# Patient Record
Sex: Female | Born: 1975 | Race: Black or African American | Hispanic: No | Marital: Married | Smoking: Never smoker
Health system: Southern US, Community
[De-identification: ages and names within clinical notes are randomized; demographics above are authoritative.]

## PROBLEM LIST (undated history)

## (undated) DIAGNOSIS — L509 Urticaria, unspecified: Secondary | ICD-10-CM

## (undated) HISTORY — DX: Urticaria, unspecified: L50.9

---

## 1998-05-05 ENCOUNTER — Emergency Department (HOSPITAL_COMMUNITY): Admission: EM | Admit: 1998-05-05 | Discharge: 1998-05-05 | Payer: Self-pay | Admitting: Emergency Medicine

## 1999-07-04 ENCOUNTER — Emergency Department (HOSPITAL_COMMUNITY): Admission: EM | Admit: 1999-07-04 | Discharge: 1999-07-04 | Payer: Self-pay | Admitting: Emergency Medicine

## 2000-06-16 ENCOUNTER — Emergency Department (HOSPITAL_COMMUNITY): Admission: EM | Admit: 2000-06-16 | Discharge: 2000-06-16 | Payer: Self-pay | Admitting: Emergency Medicine

## 2000-11-13 ENCOUNTER — Encounter: Payer: Self-pay | Admitting: Obstetrics & Gynecology

## 2000-11-13 ENCOUNTER — Inpatient Hospital Stay (HOSPITAL_COMMUNITY): Admission: AD | Admit: 2000-11-13 | Discharge: 2000-11-18 | Payer: Self-pay | Admitting: Obstetrics & Gynecology

## 2000-12-01 ENCOUNTER — Ambulatory Visit (HOSPITAL_COMMUNITY): Admission: RE | Admit: 2000-12-01 | Discharge: 2000-12-01 | Payer: Self-pay | Admitting: Obstetrics and Gynecology

## 2000-12-01 ENCOUNTER — Encounter: Payer: Self-pay | Admitting: Obstetrics and Gynecology

## 2001-01-17 ENCOUNTER — Emergency Department (HOSPITAL_COMMUNITY): Admission: EM | Admit: 2001-01-17 | Discharge: 2001-01-18 | Payer: Self-pay | Admitting: Emergency Medicine

## 2001-09-14 ENCOUNTER — Inpatient Hospital Stay (HOSPITAL_COMMUNITY): Admission: AD | Admit: 2001-09-14 | Discharge: 2001-09-14 | Payer: Self-pay | Admitting: Obstetrics and Gynecology

## 2002-04-14 ENCOUNTER — Inpatient Hospital Stay (HOSPITAL_COMMUNITY): Admission: AD | Admit: 2002-04-14 | Discharge: 2002-04-14 | Payer: Self-pay | Admitting: *Deleted

## 2002-05-25 ENCOUNTER — Inpatient Hospital Stay (HOSPITAL_COMMUNITY): Admission: AD | Admit: 2002-05-25 | Discharge: 2002-05-25 | Payer: Self-pay | Admitting: *Deleted

## 2002-07-29 ENCOUNTER — Emergency Department (HOSPITAL_COMMUNITY): Admission: EM | Admit: 2002-07-29 | Discharge: 2002-07-30 | Payer: Self-pay | Admitting: Emergency Medicine

## 2002-07-30 ENCOUNTER — Encounter: Payer: Self-pay | Admitting: Emergency Medicine

## 2002-11-07 ENCOUNTER — Encounter: Admission: RE | Admit: 2002-11-07 | Discharge: 2002-11-07 | Payer: Self-pay | Admitting: *Deleted

## 2002-11-07 ENCOUNTER — Encounter: Payer: Self-pay | Admitting: *Deleted

## 2002-11-13 ENCOUNTER — Other Ambulatory Visit: Admission: RE | Admit: 2002-11-13 | Discharge: 2002-11-13 | Payer: Self-pay | Admitting: Obstetrics and Gynecology

## 2002-12-24 ENCOUNTER — Ambulatory Visit (HOSPITAL_COMMUNITY): Admission: RE | Admit: 2002-12-24 | Discharge: 2002-12-24 | Payer: Self-pay | Admitting: Obstetrics and Gynecology

## 2002-12-24 ENCOUNTER — Encounter (INDEPENDENT_AMBULATORY_CARE_PROVIDER_SITE_OTHER): Payer: Self-pay | Admitting: Specialist

## 2003-09-16 ENCOUNTER — Inpatient Hospital Stay (HOSPITAL_COMMUNITY): Admission: AD | Admit: 2003-09-16 | Discharge: 2003-09-16 | Payer: Self-pay | Admitting: Obstetrics and Gynecology

## 2004-01-24 ENCOUNTER — Inpatient Hospital Stay (HOSPITAL_COMMUNITY): Admission: AD | Admit: 2004-01-24 | Discharge: 2004-01-25 | Payer: Self-pay | Admitting: Obstetrics and Gynecology

## 2004-07-13 ENCOUNTER — Inpatient Hospital Stay (HOSPITAL_COMMUNITY): Admission: AD | Admit: 2004-07-13 | Discharge: 2004-07-14 | Payer: Self-pay | Admitting: Obstetrics and Gynecology

## 2004-07-23 ENCOUNTER — Ambulatory Visit: Payer: Self-pay | Admitting: Obstetrics and Gynecology

## 2004-08-11 ENCOUNTER — Inpatient Hospital Stay (HOSPITAL_COMMUNITY): Admission: AD | Admit: 2004-08-11 | Discharge: 2004-08-11 | Payer: Self-pay | Admitting: Family Medicine

## 2005-08-25 ENCOUNTER — Emergency Department (HOSPITAL_COMMUNITY): Admission: EM | Admit: 2005-08-25 | Discharge: 2005-08-26 | Payer: Self-pay | Admitting: Emergency Medicine

## 2005-12-20 ENCOUNTER — Emergency Department (HOSPITAL_COMMUNITY): Admission: EM | Admit: 2005-12-20 | Discharge: 2005-12-20 | Payer: Self-pay | Admitting: Emergency Medicine

## 2006-06-17 ENCOUNTER — Ambulatory Visit (HOSPITAL_COMMUNITY): Payer: Self-pay | Admitting: Psychiatry

## 2006-06-27 ENCOUNTER — Emergency Department (HOSPITAL_COMMUNITY): Admission: EM | Admit: 2006-06-27 | Discharge: 2006-06-27 | Payer: Self-pay | Admitting: Emergency Medicine

## 2006-12-27 ENCOUNTER — Encounter: Admission: RE | Admit: 2006-12-27 | Discharge: 2006-12-27 | Payer: Self-pay | Admitting: Obstetrics

## 2006-12-29 ENCOUNTER — Ambulatory Visit: Payer: Self-pay | Admitting: Gastroenterology

## 2006-12-29 LAB — CONVERTED CEMR LAB
Albumin: 3.5 g/dL (ref 3.5–5.2)
BUN: 7 mg/dL (ref 6–23)
Basophils Absolute: 0 10*3/uL (ref 0.0–0.1)
Basophils Relative: 0.6 % (ref 0.0–1.0)
CO2: 27 meq/L (ref 19–32)
Calcium: 9.4 mg/dL (ref 8.4–10.5)
Chloride: 108 meq/L (ref 96–112)
Creatinine, Ser: 0.8 mg/dL (ref 0.4–1.2)
Eosinophils Relative: 1.4 % (ref 0.0–5.0)
GFR calc non Af Amer: 88 mL/min
Hemoglobin: 12.3 g/dL (ref 12.0–15.0)
Iron: 109 ug/dL (ref 42–145)
MCHC: 34.5 g/dL (ref 30.0–36.0)
Neutro Abs: 1.6 10*3/uL (ref 1.4–7.7)
Neutrophils Relative %: 40.8 % — ABNORMAL LOW (ref 43.0–77.0)
Saturation Ratios: 34 % (ref 20.0–50.0)
Sodium: 140 meq/L (ref 135–145)
Total Protein: 6.9 g/dL (ref 6.0–8.3)
WBC: 3.9 10*3/uL — ABNORMAL LOW (ref 4.5–10.5)

## 2007-01-01 ENCOUNTER — Emergency Department (HOSPITAL_COMMUNITY): Admission: EM | Admit: 2007-01-01 | Discharge: 2007-01-01 | Payer: Self-pay | Admitting: Emergency Medicine

## 2007-01-19 ENCOUNTER — Ambulatory Visit: Payer: Self-pay | Admitting: Gastroenterology

## 2007-01-23 ENCOUNTER — Ambulatory Visit: Payer: Self-pay | Admitting: Gastroenterology

## 2008-07-26 ENCOUNTER — Ambulatory Visit: Payer: Self-pay | Admitting: Gastroenterology

## 2008-08-09 ENCOUNTER — Emergency Department (HOSPITAL_COMMUNITY): Admission: EM | Admit: 2008-08-09 | Discharge: 2008-08-09 | Payer: Self-pay | Admitting: Emergency Medicine

## 2010-01-10 ENCOUNTER — Emergency Department (HOSPITAL_COMMUNITY): Admission: EM | Admit: 2010-01-10 | Discharge: 2010-01-10 | Payer: Self-pay | Admitting: Family Medicine

## 2010-05-19 ENCOUNTER — Emergency Department (HOSPITAL_COMMUNITY): Admission: EM | Admit: 2010-05-19 | Discharge: 2010-05-19 | Payer: Self-pay | Admitting: Emergency Medicine

## 2011-02-01 LAB — WET PREP, GENITAL

## 2011-02-01 LAB — POCT PREGNANCY, URINE: Preg Test, Ur: NEGATIVE

## 2011-02-01 LAB — POCT URINALYSIS DIP (DEVICE)
Hgb urine dipstick: NEGATIVE
Nitrite: NEGATIVE
Protein, ur: NEGATIVE mg/dL
pH: 7 (ref 5.0–8.0)

## 2011-02-01 LAB — GC/CHLAMYDIA PROBE AMP, GENITAL: GC Probe Amp, Genital: NEGATIVE

## 2011-02-09 ENCOUNTER — Emergency Department (HOSPITAL_COMMUNITY)
Admission: EM | Admit: 2011-02-09 | Discharge: 2011-02-09 | Disposition: A | Discharge status: Home / Self Care | Payer: BC Managed Care – PPO | Attending: Emergency Medicine | Admitting: Emergency Medicine

## 2011-02-09 DIAGNOSIS — M79609 Pain in unspecified limb: Secondary | ICD-10-CM | POA: Insufficient documentation

## 2011-03-26 NOTE — Group Therapy Note (Signed)
NAME:  [name], [name] NO.:  [id number]   MEDICAL RECORD NO.:  [id number]                   PATIENT TYPE:  WOC   LOCATION:  [facility]                           FACILITY:  [facility]   PHYSICIAN:  Argentina Donovan, MD                     DATE OF BIRTH:  [birth year]   DATE OF SERVICE:  07/23/2004                                    CLINIC NOTE   CHIEF COMPLAINT:  Follow-up from MAU visit for abdominal pain.   HISTORY OF PRESENT ILLNESS:  Ms. Furey is a 35 year old G2 P2-0-0-2  who presented with a few days' history of left lower quadrant pain described  as continuous, pressure-like, with a severity of 5/10.  This is not  associated with any nausea, vomiting, diarrhea, constipation.  The patient  reports having some slight vaginal discharge which was non-foul-smelling  though no vaginal or vulvar pruritus.  No dyspareunia.  No fever or chills  nor urinary symptoms.  She reports a 47-month history of menorrhagia with  periods lasting 5 days from a normal menstrual period of 3 days duration.  This is not associated with any dysmenorrhea.   PAST MEDICAL HISTORY:  Unremarkable.   SURGICAL HISTORY:  The patient had one cesarean section and a polypectomy of  a cervical polyp in 2003.   FAMILY HISTORY:  Heart disease in a grandfather and a grandmother.  Cancer  of the stomach in a grandfather.   MENSTRUAL HISTORY:  Menarche at 35 years old with regular monthly intervals  with 3 days in duration, moderate flow.  Last normal menstrual period was  August 17-21, 2005.   OBSTETRICAL HISTORY:  G2 P2-0-0-2.  One SVD and one cesarean section.   PERSONAL AND SOCIAL HISTORY:  Married.  Lives with husband and two kids.  No  smoking, no alcohol use or drug use.  No previous history of sexually-  transmitted diseases.   REVIEW OF SYSTEMS:  Negative except for left lower quadrant pain and  menorrhagia.   OBJECTIVE:  VITAL SIGNS:  Blood pressure 117/81, pulse rate of 74,  temperature of 99.4.  ABDOMEN:  Normal bowel sounds, flat, soft, no masses, no tenderness.  PELVIC:  Normal external genitalia.  Smooth vagina with minimal whitish  discharge, non-foul-smelling.  Cervix is smooth, firm, nontender on  wriggling.  Corpus is small.  No adnexal masses or tenderness.   ASSESSMENT:  Essentially gynecological findings.   PLAN:  Advised the patient to return to clinic if abdominal pain recurs.  At  the present, there is no need for further workup.  Advised her to follow up  with her primary M.D. for annual Pap smear.   ADDENDUM:  Ultrasound showed left follicular cyst 1.6 x 1.8 cm.  If her pain  persists, consider repeat transvaginal ultrasound.  Argentina Donovan, MD    PR/MEDQ  D:  07/23/2004  T:  07/23/2004  Job:  [id number]

## 2011-03-26 NOTE — Op Note (Signed)
NAME:  [name], [name]                      ACCOUNT NO.:  [id number]   MEDICAL RECORD NO.:  [id number]                   PATIENT TYPE:  AMB   LOCATION:  [facility]                                  [facility]   PHYSICIAN:  James A. Ashley Royalty, M.D.             DATE OF BIRTH:  August 04, 1976   DATE OF PROCEDURE:  12/24/2002  DATE OF DISCHARGE:                                 OPERATIVE REPORT   PREOPERATIVE DIAGNOSIS:  1. Endometrial polyp versus fibroid.  2. Menorrhagia and metrorrhagia, possibly secondary to #1.  Pathology     pending.   POSTOPERATIVE DIAGNOSIS:   OPERATION PERFORMED:  1. Diagnostic and operative hysteroscopy with removal of polyp versus     fibroid.  2. Dilation and curettage.   SURGEON:  Rudy Jew. Ashley Royalty, M.D.   ANESTHESIA:  General.   ESTIMATED BLOOD LOSS:  Less than 60 cc.   COMPLICATIONS:  None.   PACKS AND DRAINS:  None.   DEFICIT:  100 cc.   DESCRIPTION OF PROCEDURE:  The patient was taken to the operating room and  placed in the dorsal supine position.  After adequate general anesthesia was  administered, she was placed in the lithotomy position and prepped and  draped in the usual manner for vaginal surgery.  A posterior weighted  retractor was placed on the vagina.  The anterior lip of the cervix was  grasped with a single toothed tenaculum.  The uterus was gently sounded to  approximately 10 cm.  The cervix was then dilated to a size 29 [place] using  [employer] dilators.  The resectoscoped was placed into the uterine cavity using  Sorbitol as a distention medium.  The uterine cavity was inspected  completely.  The left and right tubal ostia were visualized without  difficulty.  The anterior and posterior aspects of the uterine corpus were  inspected.  On the right lateral aspect of the uterine corpus approximately  two thirds of the way inferiorly towards the cervix.  There was a sizable  apparent polyp.  Using the resectoscope at 90 watts cutting  wave form this  polyp was removed and submitted separately to pathology for histologic  studies.  Hemostasis was obtained with the coagulation wave form at 70 watts  power.  Appropriate photos were obtained and this portion of the procedure  terminated.   Attention was then turned to uterine curettage.  Medium-size curet was  introduced into the uterine cavity.  A four quadrant keratotomy was  performed.  Then a therapeutic technique was employed.  All curettings were  submitted to pathology for histologic studies.  At this point hemostasis was  noted.  The vaginal instruments were removed and the procedure terminated.   The patient was taken to the recovery room in excellent condition.  James A. Ashley Royalty, M.D.    JAM/MEDQ  D:  12/24/2002  T:  12/24/2002  Job:  [id number]

## 2011-03-26 NOTE — H&P (Signed)
   [name], [name]                            ACCOUNT NO.:  [id number]   MEDICAL RECORD NO.:  [id number]                   PATIENT TYPE:   LOCATION:                                       FACILITY:   PHYSICIAN:  James A. Ashley Royalty, M.D.             DATE OF BIRTH:   DATE OF ADMISSION:  DATE OF DISCHARGE:                                HISTORY & PHYSICAL   HISTORY OF PRESENT ILLNESS:  The patient is a 35 year old Gravida II, Para  II, who presented on November 13, 2002 complaining of menorrhagia and  metrorrhagia. Subsequent ultrasound with sono histogram revealed a 7 mm  echogenic focus in the mid endometrial canal adjacent to the right uterine  wall. The adnexa appeared essentially unremarkable bilaterally. The patient  presents for diagnostic/operative hysteroscopy with dilatation and  curettage.   PAST MEDICAL HISTORY:  Genital HSV.   PAST SURGICAL HISTORY:  Cesarean sections/BTST.   ALLERGIES:  PENICILLIN.   FAMILY HISTORY:  Positive for hypertension, diabetes mellitus, and breast  cancer.   SOCIAL HISTORY:  The patient denies the use of tobacco or significant  alcohol.   REVIEW OF SYSTEMS:  Noncontributory.   PHYSICAL EXAMINATION:  GENERAL: A well developed, well nourished, pleasant  black female in no acute distress.  VITAL SIGNS: Afebrile. Stable vital signs.  SKIN: Warm and dry without lesions.  LYMPH: No supraclavicular, cervical or inguinal adenopathy.  HEENT: Normocephalic.  NECK:  Supple with slight prominence to the thyroid.  LUNGS: Clear.  CARDIAC: Regular rate and rhythm. No murmur, rub, or gallop.  BREASTS: Soft and without palpable masses. No adenopathy.  ABDOMEN: Soft and nontender without masses or organomegaly. Bowel sounds are  active.  MS: No cardiovascular tenderness.  PELVIC: Deferred until examination under anesthesia.   IMPRESSION:  Menorrhagia and metrorrhagia with small intracavitary lesion  noted at sono histogram.   PLAN:  1. Diagnostic  laparoscopy and hysteroscopy.  2.     Dilatation and curettage.  3. Risks, benefits, complications and alternatives were discussed with the     patient. She states that she understands and accepts. Questions are     invited and answered.                                                 James A. Ashley Royalty, M.D.    JAM/MEDQ  D:  12/24/2002  T:  12/24/2002  Job:  [id number]

## 2011-03-26 NOTE — Assessment & Plan Note (Signed)
[facility]                         GASTROENTEROLOGY OFFICE NOTE   NAME:QUICK-YOUNGFrancesca, [name]                   MRN:          [id number]  DATE:12/29/2006                            DOB:          [birth year]    [name] is a 35 year old [place] American female referred  through the courtesy of Dr. Clearance Coots for evaluation of constipation and  rectal bleeding.   HISTORY OF PRESENT ILLNESS:  [name] has been in good health all  of her life without serious medical or surgical problems.  She has had  some chronic functional constipation for several years, alleviated  partially by fiber supplements.  She recently has been having some  painful bowel movements and seeing bright red blood per rectum.  She  does have some mild acid reflux symptoms and takes Prevacid on a p.r.n.  basis.  She follows a regular diet and denies specific food intolerances  including lactose.  She has never had colonoscopy or barium enema exams.  Her appetite is good and her weight is stable.   PAST MEDICAL HISTORY:  Entirely noncontributory except for previous  tubal ligation/cesarean section.  She has no history of chronic medical  difficulties.   FAMILY HISTORY:  Remarkable for breast cancer and diabetes, but no known  gastrointestinal problems.   SOCIAL HISTORY:  The patient is married and currently lives by herself.  She has 2 children.  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  She does not smoke or abuse ethanol.   REVIEW OF SYSTEMS:  Noncontributory.  The patient does have an IUD and  does not menstruate.   MEDICATIONS:  None.   SHE DOES HAVE A HISTORY OF PENICILLIN ALLERGY WITH URTICARIA.   EXAMINATION:  She is an attractive, healthy appearing female in no acute  distress, appearing her stated age.  She is 5 feet 7 inches tall, weighs  185 pounds.  Blood pressure 120/76 and pulse was 80 and regular.  Could  not appreciate a  stigmata of chronic liver disease.  There was no  thyromegaly or lymphadenopathy noted.  CHEST:  Clear to percussion and auscultation.  She appeared to be in a regular rhythm without significant murmurs,  gallops, or rubs.  ABDOMINAL:  No organomegaly, masses, or tenderness.  Bowel sounds were  normal.  PERIPHERAL EXTREMITIES:  Were unremarkable.   Inspection of the rectum was unremarkable except for some rectal spasm.  I could not see a definite fissure or fistula.  There were no masses or  tenderness to the rectum, and stool was guaiac negative.   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   RECOMMENDATIONS:  1. High fiber diet with daily bulking substance.  2. Twice a day sitz baths with Xyralid RC and LC lotion and cream      twice a day.  3. GI followup in 2-3 weeks' time to see how she is doing      symptomatically and consider colonoscopy exam depending on her      clinical course.  Vania Rea. Jarold Motto, MD, Caleen Essex, FAGA  Electronically Signed    DRP/MedQ  DD: 12/29/2006  DT: 12/29/2006  Job #: [id number]   cc:   Leonette Most A. Clearance Coots, M.D.

## 2011-03-26 NOTE — Discharge Summary (Signed)
[facility]  Patient:    CRISTIN, PENAFLOR                   MRN: [id number] Adm. Date:  [id number] Disc. Date: [id number] Attending:  Mickle Mallory Dictator:   Leilani Able, P.A.                           Discharge Summary  FINAL DIAGNOSES:              A 35 year old with left pyelonephritis.  HISTORY:                      This 35 year old G2, P2 presents to triage complaining of left flank pain.  Urine was performed at triage that did look infected and she was given the diagnosis of left pyelonephritis.  She was admitted for IV Ancef.  Patients white blood cell count was about 13.1 on admission and urine cultures were sent.  Patients hospital course complicated by some nausea.  She did not really have any emesis.  She continued to have pain.  Her urine culture came back showing Staph resistant to Ancef and therefore her antibiotics were changed to gentamycin on hospital day #2. Patient became afebrile during her hospital stay and she was felt ready for discharge on hospital day #5.  Her flank pain had resolved at this time.  She was sent home on a regular diet.  Told to decrease activities.  Was given seven days of Macrobid 100 mg b.i.d. and was to call with any temperatures or increase in her pain. DD:  12/14/00 TD:  12/14/00 Job: [id number]

## 2011-04-06 ENCOUNTER — Inpatient Hospital Stay (HOSPITAL_COMMUNITY)
Admission: AD | Admit: 2011-04-06 | Discharge: 2011-04-06 | Disposition: A | Discharge status: Home / Self Care | Payer: BC Managed Care – PPO | Source: Ambulatory Visit | Attending: Family Medicine | Admitting: Family Medicine

## 2011-04-06 ENCOUNTER — Inpatient Hospital Stay (HOSPITAL_COMMUNITY): Payer: BC Managed Care – PPO

## 2011-04-06 DIAGNOSIS — A499 Bacterial infection, unspecified: Secondary | ICD-10-CM | POA: Insufficient documentation

## 2011-04-06 DIAGNOSIS — N83209 Unspecified ovarian cyst, unspecified side: Secondary | ICD-10-CM

## 2011-04-06 DIAGNOSIS — N760 Acute vaginitis: Secondary | ICD-10-CM

## 2011-04-06 DIAGNOSIS — R109 Unspecified abdominal pain: Secondary | ICD-10-CM | POA: Insufficient documentation

## 2011-04-06 DIAGNOSIS — B9689 Other specified bacterial agents as the cause of diseases classified elsewhere: Secondary | ICD-10-CM | POA: Insufficient documentation

## 2011-04-06 LAB — POCT PREGNANCY, URINE: Preg Test, Ur: NEGATIVE

## 2011-04-06 LAB — DIFFERENTIAL
Eosinophils Absolute: 0.1 10*3/uL (ref 0.0–0.7)
Eosinophils Relative: 2 % (ref 0–5)
Monocytes Absolute: 0.6 10*3/uL (ref 0.1–1.0)
Neutro Abs: 2.9 10*3/uL (ref 1.7–7.7)

## 2011-04-06 LAB — URINALYSIS, ROUTINE W REFLEX MICROSCOPIC
Bilirubin Urine: NEGATIVE
Glucose, UA: NEGATIVE mg/dL
Ketones, ur: 15 mg/dL — AB
Protein, ur: NEGATIVE mg/dL
Urobilinogen, UA: 0.2 mg/dL (ref 0.0–1.0)
pH: 7 (ref 5.0–8.0)

## 2011-04-06 LAB — CBC
MCH: 31.5 pg (ref 26.0–34.0)
MCHC: 32.9 g/dL (ref 30.0–36.0)
RBC: 3.87 MIL/uL (ref 3.87–5.11)

## 2011-04-06 LAB — WET PREP, GENITAL

## 2011-04-07 LAB — URINE CULTURE: Culture  Setup Time: 201205300109

## 2011-08-10 LAB — POCT I-STAT, CHEM 8
BUN: 8
Calcium, Ion: 1.2
Glucose, Bld: 108 — ABNORMAL HIGH
Hemoglobin: 12.9

## 2011-08-20 ENCOUNTER — Inpatient Hospital Stay (INDEPENDENT_AMBULATORY_CARE_PROVIDER_SITE_OTHER)
Admission: RE | Admit: 2011-08-20 | Discharge: 2011-08-20 | Disposition: A | Discharge status: Home / Self Care | Payer: BC Managed Care – PPO | Source: Ambulatory Visit | Attending: Family Medicine | Admitting: Family Medicine

## 2011-08-20 DIAGNOSIS — J40 Bronchitis, not specified as acute or chronic: Secondary | ICD-10-CM

## 2011-08-20 DIAGNOSIS — J019 Acute sinusitis, unspecified: Secondary | ICD-10-CM

## 2014-11-14 ENCOUNTER — Encounter: Payer: Self-pay | Admitting: Gastroenterology

## 2015-05-29 ENCOUNTER — Ambulatory Visit (HOSPITAL_COMMUNITY): Payer: Self-pay | Admitting: Psychiatry

## 2015-06-03 ENCOUNTER — Other Ambulatory Visit: Payer: Self-pay | Admitting: Physician Assistant

## 2015-06-03 ENCOUNTER — Ambulatory Visit
Admission: RE | Admit: 2015-06-03 | Discharge: 2015-06-03 | Disposition: A | Discharge status: Home / Self Care | Payer: BC Managed Care – PPO | Source: Ambulatory Visit | Attending: Physician Assistant | Admitting: Physician Assistant

## 2015-06-03 DIAGNOSIS — W19XXXA Unspecified fall, initial encounter: Secondary | ICD-10-CM

## 2015-12-04 ENCOUNTER — Telehealth: Payer: Self-pay | Admitting: Podiatry

## 2015-12-04 NOTE — Telephone Encounter (Signed)
Left voicemail for pt to return call/ we received a referral from Dr. Tasia Catchings

## 2015-12-18 ENCOUNTER — Ambulatory Visit: Payer: BC Managed Care – PPO | Admitting: Podiatry

## 2015-12-29 ENCOUNTER — Encounter: Payer: Self-pay | Admitting: Podiatry

## 2015-12-29 ENCOUNTER — Ambulatory Visit (INDEPENDENT_AMBULATORY_CARE_PROVIDER_SITE_OTHER): Payer: BC Managed Care – PPO | Admitting: Podiatry

## 2015-12-29 VITALS — BP 151/107 | HR 79 | Resp 14

## 2015-12-29 DIAGNOSIS — M2041 Other hammer toe(s) (acquired), right foot: Secondary | ICD-10-CM

## 2015-12-29 NOTE — Progress Notes (Signed)
   Subjective:    Patient ID: [name], female    DOB: 09-01-1976, 40 y.o.   MRN: [id number]  HPI I have a painful thick area on my right 5th toe that Dr. Elijah Birk said is caused by my hammer toe and sent me to you to discuss treatment.     Review of Systems  All other systems reviewed and are negative.      Objective:   Physical Exam        Assessment & Plan:

## 2015-12-29 NOTE — Patient Instructions (Signed)
Pre-Operative Instructions  Congratulations, you have decided to take an important step to improving your quality of life.  You can be assured that the doctors of [facility] will be with you every step of the way.  1. Plan to be at the surgery center/hospital at least 1 (one) hour prior to your scheduled time unless otherwise directed by the surgical center/hospital staff.  You must have a responsible adult accompany you, remain during the surgery and drive you home.  Make sure you have directions to the surgical center/hospital and know how to get there on time. 2. For hospital based surgery you will need to obtain a history and physical form from your family physician within 1 month prior to the date of surgery- we will give you a form for you primary physician.  3. We make every effort to accommodate the date you request for surgery.  There are however, times where surgery dates or times have to be moved.  We will contact you as soon as possible if a change in schedule is required.   4. No Aspirin/Ibuprofen for one week before surgery.  If you are on aspirin, any non-steroidal anti-inflammatory medications (Mobic, Aleve, Ibuprofen) you should stop taking it 7 days prior to your surgery.  You make take Tylenol  For pain prior to surgery.  5. Medications- If you are taking daily heart and blood pressure medications, seizure, reflux, allergy, asthma, anxiety, pain or diabetes medications, make sure the surgery center/hospital is aware before the day of surgery so they may notify you which medications to take or avoid the day of surgery. 6. No food or drink after midnight the night before surgery unless directed otherwise by surgical center/hospital staff. 7. No alcoholic beverages 24 hours prior to surgery.  No smoking 24 hours prior to or 24 hours after surgery. 8. Wear loose pants or shorts- loose enough to fit over bandages, boots, and casts. 9. No slip on shoes, sneakers are best. 10. Bring  your boot with you to the surgery center/hospital.  Also bring crutches or a walker if your physician has prescribed it for you.  If you do not have this equipment, it will be provided for you after surgery. 11. If you have not been contracted by the surgery center/hospital by the day before your surgery, call to confirm the date and time of your surgery. 12. Leave-time from work may vary depending on the type of surgery you have.  Appropriate arrangements should be made prior to surgery with your employer. 13. Prescriptions will be provided immediately following surgery by your doctor.  Have these filled as soon as possible after surgery and take the medication as directed. 14. Remove nail polish on the operative foot. 15. Wash the night before surgery.  The night before surgery wash the foot and leg well with the antibacterial soap provided and water paying special attention to beneath the toenails and in between the toes.  Rinse thoroughly with water and dry well with a towel.  Perform this wash unless told not to do so by your physician.  Enclosed: 1 Ice pack (please put in freezer the night before surgery)   1 Hibiclens skin cleaner   Pre-op Instructions  If you have any questions regarding the instructions, do not hesitate to call our office.  [place]: [address] [place], [place] [address] [phone]  [place]: [address] [place], [place] [address] [phone]  [place]: [address]  [place], [place] [address] [phone]  Dr. Richard   Tuchman DPM, Dr. Norman Regal DPM Dr. Richard Sikora DPM, Dr. M. Todd Hyatt DPM, Dr. Kathryn Egerton DPM 

## 2015-12-31 NOTE — Progress Notes (Signed)
Subjective:     Patient ID: [name], female   DOB: 01-04-1976, 40 y.o.   MRN: [id number]  HPI patient states my right fifth toe is really bothering me and making it hard to wear shoe gear. I've tried wider shoes I tried padding I've tried cushioning it without relief and it's been getting worse for the last year   Review of Systems  All other systems reviewed and are negative.      Objective:   Physical Exam  Constitutional: She is oriented to person, place, and time.  Cardiovascular: Intact distal pulses.   Musculoskeletal: Normal range of motion.  Neurological: She is oriented to person, place, and time.  Skin: Skin is warm.  Nursing note and vitals reviewed.  neurovascular status intact muscle strength adequate range of motion within normal limits with patient found to have a keratotic lesion fifth digit right that's painful when pressed with rotation of the toe noted and fluid buildup at the interphalangeal joint. 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 At this point I do think it needs to be fixed and I reviewed the procedure and what would be required. Due to long-standing nature and failure to respond to conservative care she has opted for surgical intervention at this time I allowed her to read a consent form reviewing all possible alternative treatments and complications as listed. Patient wants surgery and signs consent form and is given all preoperative instructions and understands recovery can take up to 6 months. Patient is scheduled for surgery and is encouraged to call with any questions prior to procedure  Reviewing x-rays indicated enlargement had a proximal phalanx fifth digit right with mild rotation of the toe

## 2016-01-13 DIAGNOSIS — M2041 Other hammer toe(s) (acquired), right foot: Secondary | ICD-10-CM | POA: Diagnosis not present

## 2016-01-14 ENCOUNTER — Telehealth: Payer: Self-pay | Admitting: *Deleted

## 2016-01-14 NOTE — Telephone Encounter (Signed)
I spoke with pt she states her foot is really throbbing after her surgery yesterday with Dr. Charlsie Merlesegal.  Pt states is taking the pain medication and Aleve without relief.  I instructed pt to not be up on her foot more than 5 minutes/hour, and to sit down, remove the boot, open-ended sock and the ace wrap, then elevate for 15 minutes, then put foot level and rewrap the ace looser.  I told pt the only exception to the elevation was if after removing the ace and elevating the foot, it was more painful, then to dangle the foot for 15 minutes, then place the foot level and rewrap the ace looser and continue with the post-op instructions of ice and elevation.  Pt states understanding.

## 2016-01-16 ENCOUNTER — Encounter: Payer: Self-pay | Admitting: *Deleted

## 2016-01-16 ENCOUNTER — Telehealth: Payer: Self-pay | Admitting: Podiatry

## 2016-01-16 NOTE — Telephone Encounter (Addendum)
Pt called asking for a note for being out of work. I don't see how long she is to be out of work in the note. She will have her daughter pick up the note when ready

## 2016-01-16 NOTE — Telephone Encounter (Signed)
"  I had surgery on 01/13/2016.  I was supposed to have received a note for my job saying how long I would be out of work.  I didn't receive anything.  I called this morning and spoke to someone but I didn't hear anything back yet and I have to get this in.  I work for [employer]."  You could be out of work for about 4-6 weeks.  How long do you want to be out?  "Put me down for 4 weeks, if I need to be out longer I can get it later."  You can come by to pick it up today.  "I'll have my husband come by to pick it up."

## 2016-01-23 ENCOUNTER — Ambulatory Visit (INDEPENDENT_AMBULATORY_CARE_PROVIDER_SITE_OTHER): Payer: BC Managed Care – PPO | Admitting: Podiatry

## 2016-01-23 ENCOUNTER — Ambulatory Visit: Payer: Self-pay

## 2016-01-23 VITALS — Temp 98.5°F

## 2016-01-23 DIAGNOSIS — M2041 Other hammer toe(s) (acquired), right foot: Secondary | ICD-10-CM | POA: Diagnosis not present

## 2016-01-23 DIAGNOSIS — Z9889 Other specified postprocedural states: Secondary | ICD-10-CM | POA: Diagnosis not present

## 2016-01-26 NOTE — Progress Notes (Signed)
Subjective:     Patient ID: [name], female   DOB: 1976/04/28, 40 y.o.   MRN: [id number]  HPI patient states I'm doing real well with my right foot   Review of Systems     Objective:   Physical Exam Neurovascular status intact negative Homans sign noted with well healed right fifth toe with wound edges well coapted toe in good alignment with minimal edema    Assessment:     Doing well post arthroplasty digit 5 right    Plan:     Reapplied sterile dressing reviewed x-rays and advised on continued elevation. Reappoint for us to recheck  X-ray report indicated satisfactory section had a proximal phalanx right with no indications of pathology

## 2016-02-02 IMAGING — CR DG KNEE COMPLETE 4+V*R*
3 series · 3 of 3 positions shown · non-contrast
Comparison: None.

CLINICAL DATA: Fell 1 week ago with pain and swelling medially

EXAM:
RIGHT KNEE - COMPLETE 4+ VIEW

[w knee ap right (1 of 2)]
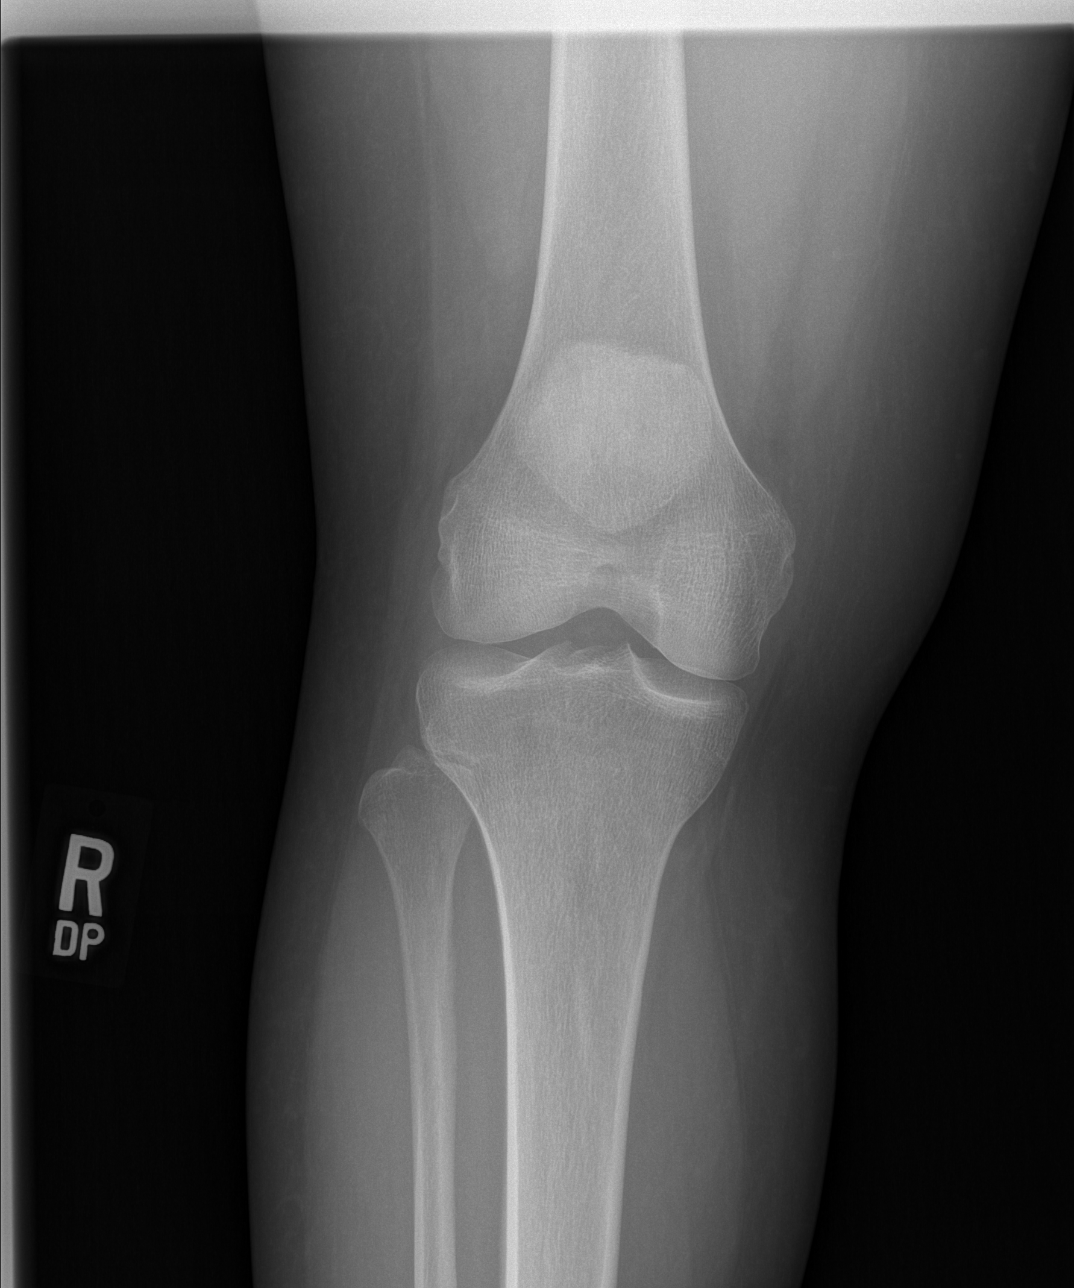

[w knee ap right (2 of 2)]
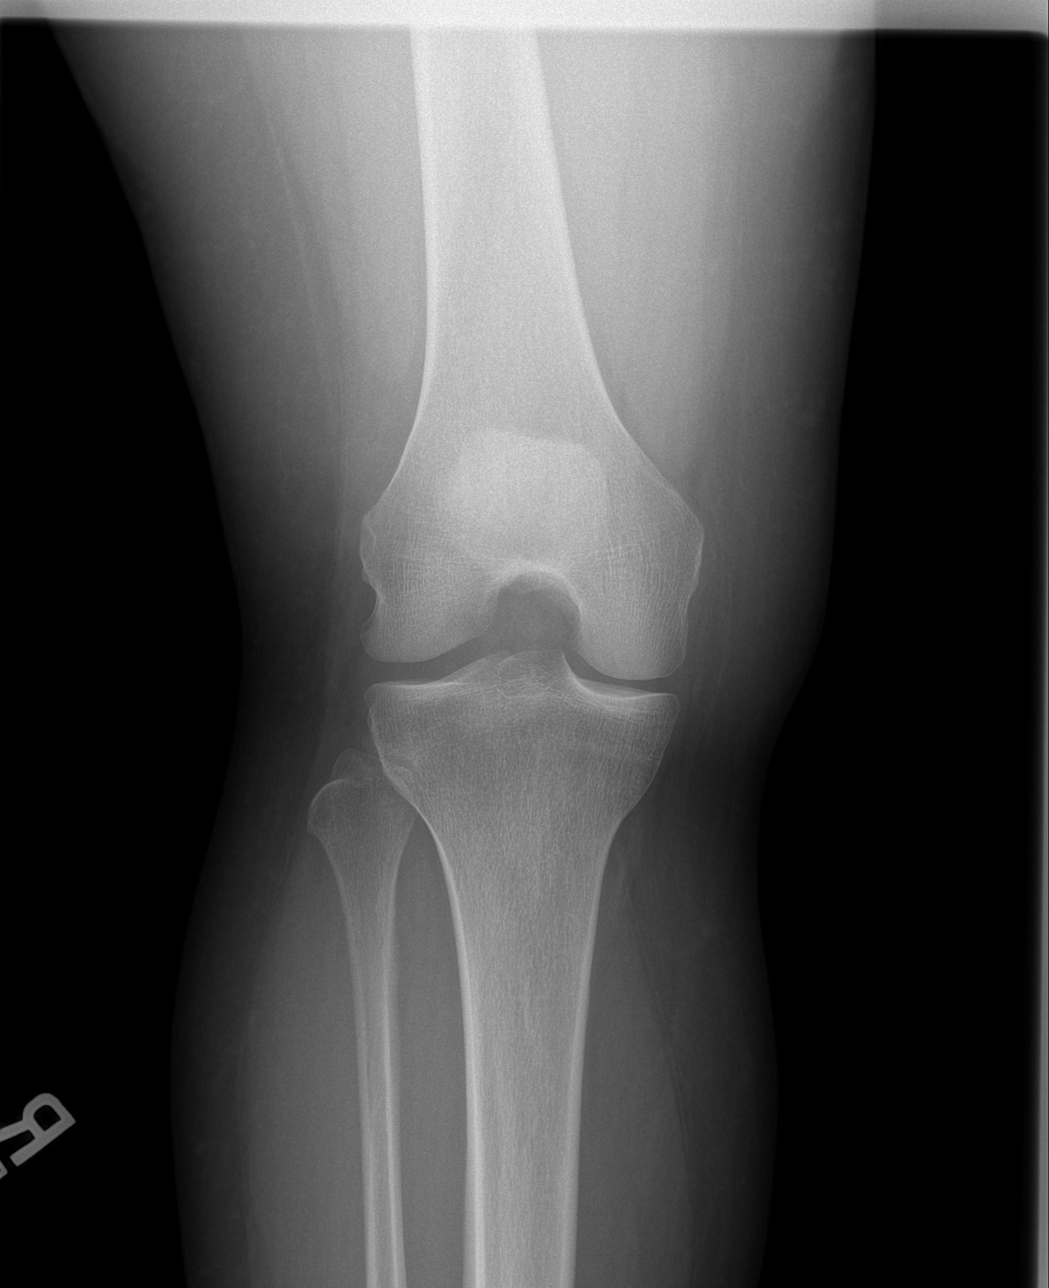

[w knee lat. right]
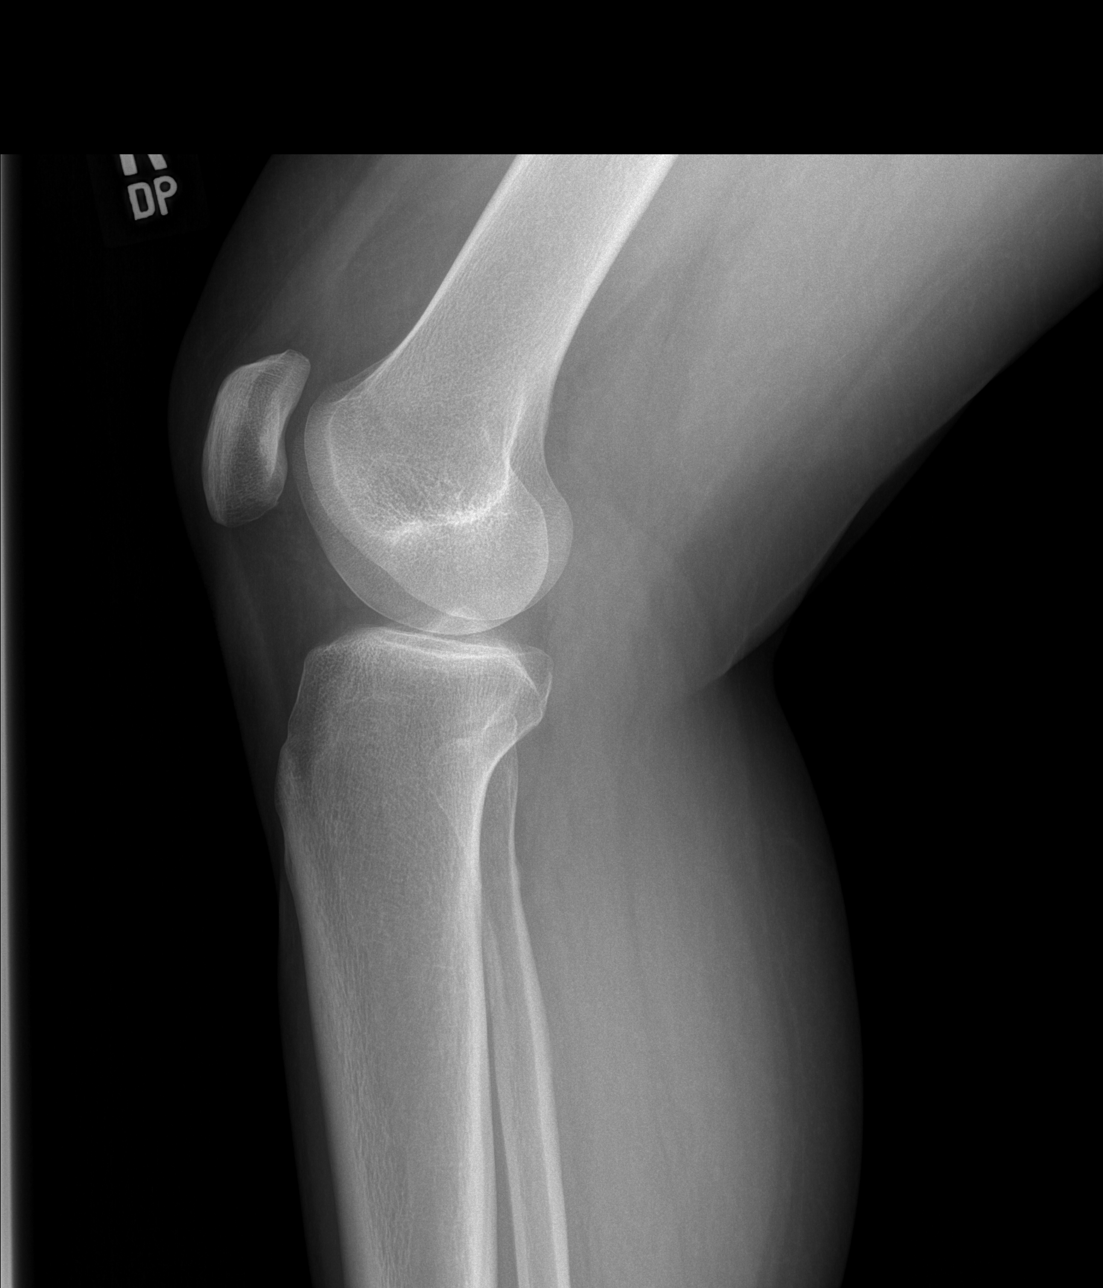

[3 of 3 positions shown; findings below may reference images not displayed]

FINDINGS: No acute fracture is seen. No joint effusion is noted. there may be
minimal decrease in medial joint space. The patella is slightly
laterally positioned.
IMPRESSION: No acute abnormality.  Slightly diminished medial joint space.

## 2016-02-04 ENCOUNTER — Ambulatory Visit (INDEPENDENT_AMBULATORY_CARE_PROVIDER_SITE_OTHER): Payer: BC Managed Care – PPO | Admitting: Podiatry

## 2016-02-04 ENCOUNTER — Ambulatory Visit (INDEPENDENT_AMBULATORY_CARE_PROVIDER_SITE_OTHER): Payer: BC Managed Care – PPO

## 2016-02-04 DIAGNOSIS — M2041 Other hammer toe(s) (acquired), right foot: Secondary | ICD-10-CM

## 2016-02-04 DIAGNOSIS — Z9889 Other specified postprocedural states: Secondary | ICD-10-CM

## 2016-02-04 NOTE — Progress Notes (Signed)
Subjective:     Patient ID: [name], female   DOB: May 06, 1976, 40 y.o.   MRN: [id number]  HPI patient states I'm doing real well with my toe with minimal swelling and incision site that's healing well   Review of Systems     Objective:   Physical Exam Neurovascular status intact negative Homans sign noted with wound edges well coapted fifth digit right with stitches intact    Assessment:     Doing well post arthroplasty digit 5 right    Plan:     Stitches removed x-ray reviewed wound edges were instructed on continued elevation and using open toed shoes. Reappoint as needed  X-ray report indicated assessment dissection had approximate the hallux with no indications of pathology

## 2016-07-02 ENCOUNTER — Encounter: Payer: Self-pay | Admitting: *Deleted

## 2016-07-02 NOTE — Progress Notes (Signed)
   DOS 01-13-16  Hammer toe 5th toe right

## 2018-04-13 ENCOUNTER — Encounter (INDEPENDENT_AMBULATORY_CARE_PROVIDER_SITE_OTHER): Payer: BC Managed Care – PPO

## 2018-04-24 ENCOUNTER — Ambulatory Visit (INDEPENDENT_AMBULATORY_CARE_PROVIDER_SITE_OTHER): Payer: BC Managed Care – PPO | Admitting: Family Medicine

## 2019-04-18 ENCOUNTER — Telehealth: Payer: Self-pay | Admitting: Cardiology

## 2019-04-18 NOTE — Telephone Encounter (Signed)
Routed to MD's nurse. OK to put patient on DOD day for new patient eval for chest tightness - no recent EKG in system

## 2019-04-18 NOTE — Telephone Encounter (Signed)
LMTCB - NEW Patient.  Please schedule with Dr. Harrell Gave week of June 15.  Thanks

## 2019-04-18 NOTE — Telephone Encounter (Signed)
Follow up    Pt is scheduled for a new patient appt and wants to know if she can be seen in office or if it has to be a Virtual Visit     Please call back

## 2019-04-19 NOTE — Telephone Encounter (Signed)
Pt returned this office call please give her a call back. °

## 2019-04-19 NOTE — Telephone Encounter (Signed)
   Pt state she would prefer to be seen in office. Appointment changed to 6/26 at 3 pm with Dr. Harrell Gave.  COVID-19 Pre-Screening Questions:  . In the past 7 to 10 days have you had a cough,  shortness of breath, headache, congestion, fever (100 or greater) body aches, chills, sore throat, or sudden loss of taste or sense of smell? No . Have you been around anyone with known Covid 19. No . Have you been around anyone who is awaiting Covid 19 test results in the past 7 to 10 days?N o . Have you been around anyone who has been exposed to Covid 19, or has mentioned symptoms of Covid 19 within the past 7 to 10 days? No  If you have any concerns/questions about symptoms patients report during screening (either on the phone or at threshold). Contact the provider seeing the patient or DOD for further guidance.  If neither are available contact a member of the leadership team.

## 2019-04-19 NOTE — Telephone Encounter (Signed)
Left message to call back  

## 2019-04-23 ENCOUNTER — Ambulatory Visit: Payer: Self-pay | Admitting: Surgery

## 2019-04-23 NOTE — H&P (Signed)
[name] Documented: 04/23/2019 10:58 AM Location: [facility] Patient #: [id number] DOB: [birth year] Married / Language: English / Race: Black or African American Female  History of Present Illness [name](Dalton Molesworth C. Ronel Rodeheaver MD; 04/23/2019 11:39 AM) The patient is a 43 year old female who presents with hemorrhoids. Note for "Hemorrhoids": ` ` ` Patient sent for surgical consultation at the request of Dr Annita Brodousin, [facility]  Chief Complaint: Prolapsing hemorrhoids ` ` The patient is a pleasant obese woman struggled with hemorrhoids that pop out. Occasional bleeding irritation. Discussed with her gynecologist. Surgical consultation offered. She's had hemorrhoid issues for the past few decades. This past 6 months no she feels like something is out all the time with occasional bleeding and irritation. Trying to use topical medications & soaks but still frustrating. She does have a history of irregular bowels for most of her life where she can be constipated for week and then have numerous bowel movements for a few days. She had a colonoscopy by Dr. Jarold MottoPatterson with [facility] in 2008 that was completely normal. She does not smoke. Not diabetic. She can walk at least a half hour without difficulty. She's had C-sections but no other abdominal surgery.  No personal nor family history of GI/colon cancer, inflammatory bowel disease, irritable bowel syndrome, allergy such as Celiac Sprue, dietary/dairy problems, colitis, ulcers nor gastritis. No recent sick contacts/gastroenteritis. No travel outside the country. No changes in diet. No dysphagia to solids or liquids. No significant heartburn or reflux. No hematemesis, coffee ground emesis. No evidence of prior gastric/peptic ulceration.  (Review of systems as stated in this history (HPI) or in the review of systems. Otherwise all other 12 point ROS are negative) ` ` `   Past Surgical History Kristen Cardinal(Sabrina  Canty, CMA; 04/23/2019 10:58 AM) Cesarean Section - 1 Foot Surgery Right. Oral Surgery  Diagnostic Studies History Kristen Cardinal(Sabrina Canty, CMA; 04/23/2019 10:58 AM) Colonoscopy >10 years ago Mammogram within last year Pap Smear 1-5 years ago  Allergies Kristen Cardinal(Sabrina Canty, CMA; 04/23/2019 10:59 AM) Penicillins Allergies Reconciled  Medication History (Sabrina Canty, CMA; 04/23/2019 11:00 AM) Dicyclomine HCl (20MG  Tablet, Oral) Active. Clindamycin Phos-Benzoyl Perox (1.2-5% Gel, External) Active. Meloxicam (15MG  Tablet, Oral) Active. Hydrocortisone (Perianal) (2.5% Cream, External) Active. Hyoscyamine Sulfate (0.125MG  Tablet, Oral) Active. valACYclovir HCl (500MG  Tablet, Oral) Active. Saxenda (18MG /3ML Soln Pen-inj, Subcutaneous) Active. Pantoprazole Sodium (40MG  Tablet DR, Oral) Active. Medications Reconciled  Social History Kristen Cardinal(Sabrina Canty, CMA; 04/23/2019 10:58 AM) Alcohol use Occasional alcohol use. Caffeine use Carbonated beverages. No drug use Tobacco use Never smoker.  Family History Kristen Cardinal([place]abrina [place]; 04/23/2019 10:58 AM) First Degree Relatives No pertinent family history  Pregnancy / Birth History Kristen Cardinal(Sabrina Canty, CMA; 04/23/2019 10:58 AM) Contraceptive History Contraceptive implant. Gravida 2 Length (months) of breastfeeding 3-6 Maternal age 45-25 Para 2  Other Problems Kristen Cardinal(Sabrina Canty, CMA; 04/23/2019 10:58 AM) Gastroesophageal Reflux Disease     Review of Systems (Sabrina Canty CMA; 04/23/2019 10:58 AM) General Not Present- Appetite Loss, Chills, Fatigue, Fever, Night Sweats, Weight Gain and Weight Loss. Skin Not Present- Change in Wart/Mole, Dryness, Hives, Jaundice, New Lesions, Non-Healing Wounds, Rash and Ulcer. HEENT Not Present- Earache, Hearing Loss, Hoarseness, Nose Bleed, Oral Ulcers, Ringing in the Ears, Seasonal Allergies, Sinus Pain, Sore Throat, Visual Disturbances, Wears glasses/contact lenses and Yellow Eyes. Respiratory Not Present-  Bloody sputum, Chronic Cough, Difficulty Breathing, Snoring and Wheezing. Cardiovascular Not Present- Chest Pain, Difficulty Breathing Lying Down, Leg Cramps, Palpitations, Rapid Heart Rate, Shortness of Breath and Swelling of Extremities.  Gastrointestinal Not Present- Abdominal Pain, Bloating, Bloody Stool, Change in Bowel Habits, Chronic diarrhea, Constipation, Difficulty Swallowing, Excessive gas, Gets full quickly at meals, Hemorrhoids, Indigestion, Nausea, Rectal Pain and Vomiting. Female Genitourinary Not Present- Frequency, Nocturia, Painful Urination, Pelvic Pain and Urgency. Musculoskeletal Not Present- Back Pain, Joint Pain, Joint Stiffness, Muscle Pain, Muscle Weakness and Swelling of Extremities. Neurological Not Present- Decreased Memory, Fainting, Headaches, Numbness, Seizures, Tingling, Tremor, Trouble walking and Weakness. Psychiatric Not Present- Anxiety, Bipolar, Change in Sleep Pattern, Depression, Fearful and Frequent crying. Endocrine Not Present- Cold Intolerance, Excessive Hunger, Hair Changes, Heat Intolerance, Hot flashes and New Diabetes. Hematology Not Present- Blood Thinners, Easy Bruising, Excessive bleeding, Gland problems, HIV and Persistent Infections.  Vitals (Sabrina Canty CMA; 04/23/2019 11:00 AM) 04/23/2019 11:00 AM Weight: 187 lb Height: 68in Body Surface Area: 1.99 m Body Mass Index: 28.43 kg/m  Temp.: 97.28F(Temporal)  Pulse: 103 (Regular)  BP: 138/60 (Sitting, Left Arm, Standard)        Physical Exam [name](Geraldene Eisel C. Adelene Polivka MD; 04/23/2019 11:36 AM)  General Mental Status-Alert. General Appearance-Not in acute distress, Not Sickly. Orientation-Oriented X3. Hydration-Well hydrated. Voice-Normal.  Integumentary Global Assessment Upon inspection and palpation of skin surfaces of the - Axillae: non-tender, no inflammation or ulceration, no drainage. and Distribution of scalp and body hair is normal. General  Characteristics Temperature - normal warmth is noted.  Head and Neck Head-normocephalic, atraumatic with no lesions or palpable masses. Face Global Assessment - atraumatic, no absence of expression. Neck Global Assessment - no abnormal movements, no bruit auscultated on the right, no bruit auscultated on the left, no decreased range of motion, non-tender. Trachea-midline. Thyroid Gland Characteristics - non-tender.  Eye Eyeball - Left-Extraocular movements intact, No Nystagmus - Left. Eyeball - Right-Extraocular movements intact, No Nystagmus - Right. Cornea - Left-No Hazy - Left. Cornea - Right-No Hazy - Right. Sclera/Conjunctiva - Left-No scleral icterus, No Discharge - Left. Sclera/Conjunctiva - Right-No scleral icterus, No Discharge - Right. Pupil - Left-Direct reaction to light normal. Pupil - Right-Direct reaction to light normal.  ENMT Ears Pinna - Left - no drainage observed, no generalized tenderness observed. Pinna - Right - no drainage observed, no generalized tenderness observed. Nose and Sinuses External Inspection of the Nose - no destructive lesion observed. Inspection of the nares - Left - quiet respiration. Inspection of the nares - Right - quiet respiration. Mouth and Throat Lips - Upper Lip - no fissures observed, no pallor noted. Lower Lip - no fissures observed, no pallor noted. Nasopharynx - no discharge present. Oral Cavity/Oropharynx - Tongue - no dryness observed. Oral Mucosa - no cyanosis observed. Hypopharynx - no evidence of airway distress observed.  Chest and Lung Exam Inspection Movements - Normal and Symmetrical. Accessory muscles - No use of accessory muscles in breathing. Palpation Palpation of the chest reveals - Non-tender. Auscultation Breath sounds - Normal and Clear.  Cardiovascular Auscultation Rhythm - Regular. Murmurs & Other Heart Sounds - Auscultation of the heart reveals - No Murmurs and No Systolic  Clicks.  Abdomen Inspection Inspection of the abdomen reveals - No Visible peristalsis and No Abnormal pulsations. Umbilicus - No Bleeding, No Urine drainage. Palpation/Percussion Palpation and Percussion of the abdomen reveal - Soft, Non Tender, No Rebound tenderness, No Rigidity (guarding) and No Cutaneous hyperesthesia. Note: Abdomen soft. Not severely distended. No distasis recti. No umbilical or other anterior abdominal wall hernias  Female Genitourinary Sexual Maturity Tanner 5 - Adult hair pattern. Note: No vaginal bleeding nor discharge  Rectal Note: Right anterior greater  than posterior external hemorrhoids. Tolerates digital & anoscopic internal exam. Left lateral swollen irritated internal hemorrhoid that appears to prolapse easily as well. Right anterior irritated.  Perianal skin clean with good hygiene. No pruritis ani. No pilonidal disease. No fissure. No abscess/fistula. Normal sphincter tone. No condyloma warts. Tolerates digital and anoscopic rectal exam. No rectal masses. Exam done with assistance of female Medical Assistant in the room.  Peripheral Vascular Upper Extremity Inspection - Left - No Cyanotic nailbeds - Left, Not Ischemic. Inspection - Right - No Cyanotic nailbeds - Right, Not Ischemic.  Neurologic Neurologic evaluation reveals -normal attention span and ability to concentrate, able to name objects and repeat phrases. Appropriate fund of knowledge , normal sensation and normal coordination. Mental Status Affect - not angry, not paranoid. Cranial Nerves-Normal Bilaterally. Gait-Normal.  Neuropsychiatric Mental status exam performed with findings of-able to articulate well with normal speech/language, rate, volume and coherence, thought content normal with ability to perform basic computations and apply abstract reasoning and no evidence of hallucinations, delusions, obsessions or homicidal/suicidal ideation.  Musculoskeletal Global  Assessment Spine, Ribs and Pelvis - no instability, subluxation or laxity. Right Upper Extremity - no instability, subluxation or laxity.  Lymphatic Head & Neck  General Head & Neck Lymphatics: Bilateral - Description - No Localized lymphadenopathy. Axillary  General Axillary Region: Bilateral - Description - No Localized lymphadenopathy. Femoral & Inguinal  Generalized Femoral & Inguinal Lymphatics: Left - Description - No Localized lymphadenopathy. Right - Description - No Localized lymphadenopathy.   Results Adin Hector MD; 04/23/2019 11:36 AM) Procedures  Name Value Date Hemorrhoids Procedure Anal exam: External Hemorrhoid Internal exam: Internal Hemorroids ( non-bleeding) Internal Hemorrhoids (bleeding) prolapse Other: Right anterior greater than posterior external hemorrhoids. Tolerates digital & anoscopic internal exam. Left lateral swollen irritated internal hemorrhoid that appears to prolapse easily as well. Right anterior irritated. ......  ...... Perianal skin clean with good hygiene. No pruritis ani. No pilonidal disease. No fissure. No abscess/fistula. Normal sphincter tone.  ...... No condyloma warts. Tolerates digital and anoscopic rectal exam. No rectal masses.  ...... Exam done with assistance of female Medical Assistant in the room.  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 Not amenable to banding. Also external hemorrhoid components. All the anatomy is not that impressive, she does have progressive symptoms.  Think she would benefit from examination under anesthesia with internal hemorrhoidal ligation pexy and probable hemorrhoidectomy. Outpatient surgery. Did caution that she will have issues of sharp pain especially the first few weeks. Takes a few months for bleeding and  drainage calm down.  I noted her hemorrhoid issues or a side effect of her irregular bowels with alternating constipation and diarrhea. I strongly recommend she use a fiber bowel supplement to try and help normalize her bowels and consider seeing gastroenterology if that does not work. She can get her bowels moving more normally, that'll help prevent future hemorrhoid issues, let alone getting over surgery more easily.  Current Plans ANOSCOPY, DIAGNOSTIC (93810) Pt Education - Pamphlet Given - The Hemorrhoid Book: discussed with patient and provided information. Pt Education - [facility] Hemorrhoids (Anairis Knick): discussed with patient and provided information.  ENCOUNTER FOR PREOPERATIVE EXAMINATION FOR GENERAL SURGICAL PROCEDURE (Z01.818)  Current Plans You are being scheduled for surgery- Our schedulers will call you.  You should hear from our office's scheduling department within 5 working days about the location,  date, and time of surgery. We try to make accommodations for patient's preferences in scheduling surgery, but sometimes the OR schedule or the surgeon's schedule prevents us from making those accommodations.  If you have not heard from our office [phone]) in 5 working days, call the office and ask for your surgeon's nurse.  If you have other questions about your diagnosis, plan, or surgery, call the office and ask for your surgeon's nurse.  The anatomy & physiology of the anorectal region was discussed. The pathophysiology of hemorrhoids and differential diagnosis was discussed. Natural history risks without surgery was discussed. I stressed the importance of a bowel regimen to have daily soft bowel movements to minimize progression of disease. Interventions such as sclerotherapy & banding were discussed.  The patient's symptoms are not adequately controlled by medicines and other non-operative treatments. I feel the risks & problems of no surgery outweigh the operative risks;  therefore, I recommended surgery to treat the hemorrhoids by ligation, pexy, and possible resection.  Risks such as bleeding, infection, urinary difficulties, need for further treatment, heart attack, death, and other risks were discussed. I noted a good likelihood this will help address the problem. Goals of post-operative recovery were discussed as well. Possibility that this will not correct all symptoms was explained. Post-operative pain, bleeding, constipation, and other problems after surgery were discussed. We will work to minimize complications. Educational handouts further explaining the pathology, treatment options, and bowel regimen were given as well. Questions were answered. The patient expresses understanding & wishes to proceed with surgery.  Pt Education - [facility] Rectal Prep for Anorectal outpatient/office surgery: discussed with patient and provided information. Pt Education - [facility] Rectal Surgery HCI (Kenson Groh): discussed with patient and provided information.  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 Suspicious for irritable bowel syndrome. I recommended bowel regimen and further instructions. Consider consultation with gastroenterology if that does not help to help normalize her bowels and prevent future anorectal/hemorrhoidal problems  Current Plans Pt Education - [facility] Good Bowel Health (Gaila Engebretsen) Pt Education - [facility] IBS patient info: discussed with patient and provided information.  Cassidy SportsmanSteven C. Shaydon Lease, MD, FACS, MASCRS Gastrointestinal and Minimally Invasive Surgery    [address] [place], [place] [id number] [phone] Main / Paging [phone] Fax

## 2019-05-03 ENCOUNTER — Telehealth: Payer: Self-pay | Admitting: Cardiology

## 2019-05-03 NOTE — Telephone Encounter (Signed)
LVM for pt, reminding her of her appt with Dr Harrell Gave on 05-04-19.

## 2019-05-04 ENCOUNTER — Other Ambulatory Visit: Payer: Self-pay

## 2019-05-04 ENCOUNTER — Encounter: Payer: Self-pay | Admitting: Cardiology

## 2019-05-04 ENCOUNTER — Ambulatory Visit (INDEPENDENT_AMBULATORY_CARE_PROVIDER_SITE_OTHER): Payer: BC Managed Care – PPO | Admitting: Cardiology

## 2019-05-04 VITALS — BP 124/92 | HR 104 | Temp 97.7°F | Ht 67.0 in | Wt 183.0 lb

## 2019-05-04 DIAGNOSIS — Z01812 Encounter for preprocedural laboratory examination: Secondary | ICD-10-CM

## 2019-05-04 DIAGNOSIS — Z8249 Family history of ischemic heart disease and other diseases of the circulatory system: Secondary | ICD-10-CM | POA: Diagnosis not present

## 2019-05-04 DIAGNOSIS — Z7189 Other specified counseling: Secondary | ICD-10-CM | POA: Diagnosis not present

## 2019-05-04 DIAGNOSIS — R072 Precordial pain: Secondary | ICD-10-CM | POA: Diagnosis not present

## 2019-05-04 DIAGNOSIS — R000 Tachycardia, unspecified: Secondary | ICD-10-CM | POA: Diagnosis not present

## 2019-05-04 MED ORDER — METOPROLOL TARTRATE 50 MG PO TABS: ORAL_TABLET | ORAL | 0 refills | Status: DC

## 2019-05-04 NOTE — Progress Notes (Signed)
Cardiology Office Note:    Date:  05/04/2019   ID:  [name], DOB 26-Sep-1976, MRN [id number]  PCP:  Aurea Graff.Marlou Sa, MD  Cardiologist:  Buford Dresser, MD PhD  Referring MD: Aurea Graff.Marlou Sa, MD   CC: consult for chest tightness  History of Present Illness:    [name] is a 43 y.o. female with a hx of GERD, hemorrhoids, obesity who is seen as a new consult at the request of [name], L.Marlou Sa, MD for the evaluation and management of chest tightness.  Patient concerns today: In addition to chest tightness, also concerned about elevated heart rate, which she feels is constant. Per her watch, stays in the 120s even when resting.   Chest pain: -Initial onset: about a month ago, was riding in the car and felt a "pulling" in her chest. Eased up after about an hour, but never really went away. Went to Dr. [name], had ECG done (NSR, PRWP, nonspecific ST changes--light scan so cannot quantitate on grid) -Quality: heavy/tight -Frequency: now happening about 2x/day -Duration: 60-90 mins -Associated symptoms: none -Aggravating/alleviating factors: no clear triggers. Better if she sits and rests. Not positional. No recent infectious symptoms. -Prior cardiac history: none -Prior workup: none -Prior treatment: none -Alcohol: 1 glass of wine/every two weeks -Tobacco: never -Comorbidities: overweight -Exercise level: walk 5 days/week, 1.5 miles each. No symptoms while being active -Cardiac ROS: no shortness of breath, no PND, no orthopnea, no LE edema, no syncope -Family history: grandfather died in his sleep from MI.  PMH: GERD, overweight  PSH: c-section, toe surgery  Current Medications: Current Outpatient Medications on File Prior to Visit  Medication Sig  . Liraglutide -Weight Management (SAXENDA [place]) Inject 1.8 Units into the skin daily.  . pantoprazole (PROTONIX) 40 MG tablet Take 40 mg by mouth daily.   No current facility-administered medications  on file prior to visit.      Allergies:   Penicillins   Social History   Socioeconomic History  . Marital status: Married    Spouse name: Not on file  . Number of children: Not on file  . Years of education: Not on file  . Highest education level: Not on file  Occupational History  . Not on file  Social Needs  . Financial resource strain: Not on file  . Food insecurity    Worry: Not on file    Inability: Not on file  . Transportation needs    Medical: Not on file    Non-medical: Not on file  Tobacco Use  . Smoking status: Not on file  Substance and Sexual Activity  . Alcohol use: Not on file  . Drug use: Not on file  . Sexual activity: Not on file  Lifestyle  . Physical activity    Days per week: Not on file    Minutes per session: Not on file  . Stress: Not on file  Relationships  . Social Herbalist on phone: Not on file    Gets together: Not on file    Attends religious service: Not on file    Active member of club or organization: Not on file    Attends meetings of clubs or organizations: Not on file    Relationship status: Not on file  Other Topics Concern  . Not on file  Social History Narrative  . Not on file     Family History: grandfather died in his sleep from MI.  ROS:   Please see the history  of present illness.  Additional pertinent ROS:  Constitutional: Negative for chills, fever, night sweats, unintentional weight loss  HENT: Negative for ear pain and hearing loss.   Eyes: Negative for loss of vision and eye pain.  Respiratory: Negative for cough, sputum, shortness of breath, wheezing.   Cardiovascular: See HPI. Gastrointestinal: Negative for abdominal pain, melena, and hematochezia (other than mild hemorrhoids) Genitourinary: Negative for dysuria and hematuria.  Musculoskeletal: Negative for falls and myalgias.  Skin: Negative for itching and rash.  Neurological: Negative for focal weakness, focal sensory changes and loss of  consciousness.  Endo/Heme/Allergies: Does not bruise/bleed easily.    EKGs/Labs/Other Studies Reviewed:    The following studies were reviewed today: Notes and ECG from Dr. Clovis RileyMitchell  EKG:  EKG is personally reviewed.  The ekg ordered today demonstrates low voltage, sinus tach at 104 bpm, PRWP, limb leads low voltage  Recent Labs: No results found for requested labs within last 8760 hours.  Recent Lipid Panel No results found for: CHOL, TRIG, HDL, CHOLHDL, VLDL, LDLCALC, LDLDIRECT  Physical Exam:    VS:  BP (!) 124/92 (BP Location: Left Arm, Patient Position: Sitting, Cuff Size: Normal)   Pulse (!) 104   Temp 97.7 F (36.5 C)   Ht 5\' 7"  (1.702 m)   Wt 183 lb (83 kg)   BMI 28.66 kg/m     Wt Readings from Last 3 Encounters:  05/04/19 183 lb (83 kg)     GEN: Well nourished, well developed in no acute distress HEENT: Normal NECK: No JVD; No carotid bruits LYMPHATICS: No lymphadenopathy CARDIAC: regular rhythm, normal S1 and S2, no murmurs, rubs, gallops. Radial and DP pulses 2+ bilaterally. RESPIRATORY:  Clear to auscultation without rales, wheezing or rhonchi  ABDOMEN: Soft, non-tender, non-distended MUSCULOSKELETAL:  No edema; No deformity  SKIN: Warm and dry NEUROLOGIC:  Alert and oriented x 3 PSYCHIATRIC:  Normal affect   ASSESSMENT:    1. Precordial pain   2. Pre-procedure lab exam   3. Tachycardia   4. Cardiac risk counseling   5. Family history of heart disease   6. Counseling on health promotion and disease prevention    PLAN:    Chest pain: somewhat atypical, but has a family history and risk factors. We discussed multiple options for evaluation, as well as risk/benefit for each. After shared decision making, we elected to pursue: -echocardiogram given ECG abnormalities -coronary CT. Discussed PO metoprolol prior and SL NG (may need additional metoprolol given her tachycardia, which we discussed) -BMET prior to CT coronary  Tachycardia: sinus today, but  feels that it is constant, including at rest -Zio monitor to evaluate for arrhythmia as well as determine if her heart rate naturally dips with sleep  Cardiac risk counseling and prevention recommendations: -recommend heart healthy/Mediterranean diet, with whole grains, fruits, vegetable, fish, lean meats, nuts, and olive oil. Limit salt. -recommend moderate walking, 3-5 times/week for 30-50 minutes each session. Aim for at least 150 minutes.week. Goal should be pace of 3 miles/hours, or walking 1.5 miles in 30 minutes -recommend avoidance of tobacco products. Avoid excess alcohol. -Additional risk factor control:  -Diabetes: A1c is 5.3, no history of diabetes (on saxenda for weight mgmt)  -Lipids: per [facility], 12/2018: Tchol 166, HDL 39, LDL 112, TG 74  -Blood pressure control: at goal today on no meds  -Weight: BMI 28 -ASCVD risk score: 1.4% based on today's data  Plan for follow up: 3 mos or sooner based on results of testing  Medication Adjustments/Labs  and Tests Ordered: Current medicines are reviewed at length with the patient today.  Concerns regarding medicines are outlined above.  Orders Placed This Encounter  Procedures  . CT CORONARY MORPH W/CTA COR W/SCORE W/CA W/CM &/OR WO/CM  . CT CORONARY FRACTIONAL FLOW RESERVE DATA PREP  . CT CORONARY FRACTIONAL FLOW RESERVE FLUID ANALYSIS  . Basic metabolic panel  . LONG TERM MONITOR (3-14 DAYS)  . EKG 12-Lead  . ECHOCARDIOGRAM COMPLETE   Meds ordered this encounter  Medications  . metoprolol tartrate (LOPRESSOR) 50 MG tablet    Sig: TAKE 1 TABLET 2 HR PRIOR TO CARDIAC PROCEDURE    Dispense:  1 tablet    Refill:  0    Patient Instructions  Medication Instructions:  Your Physician recommend you continue on your current medication as directed.    If you need a refill on your cardiac medications before your next appointment, please call your pharmacy.   Lab work: Your physician recommends that you return for lab work 1 week prior  to procedure   Testing/Procedures: Your physician has requested that you have an echocardiogram. Echocardiography is a painless test that uses sound waves to create images of your heart. It provides your doctor with information about the size and shape of your heart and how well your heart's chambers and valves are working. This procedure takes approximately one hour. There are no restrictions for this procedure. [address]  Your physician has requested that you have cardiac CT. Cardiac computed tomography (CT) is a painless test that uses an x-ray machine to take clear, detailed pictures of your heart. For further information please visit [email or web address]. Please follow instruction sheet as given. [facility]  Our physician has recommended that you wear an 7  DAY ZIO-PATCH monitor. The Zio patch cardiac monitor continuously records heart rhythm data for up to 14 days, this is for patients being evaluated for multiple types heart rhythms. For the first 24 hours post application, please avoid getting the Zio monitor wet in the shower or by excessive sweating during exercise. After that, feel free to carry on with regular activities. Keep soaps and lotions away from the ZIO XT Patch.   This will be placed at our [facility] location - [address], [address].        Follow-Up: At [facility], you and your health needs are our priority.  As part of our continuing mission to provide you with exceptional heart care, we have created designated Provider Care Teams.  These Care Teams include your primary Cardiologist (physician) and Advanced Practice Providers (APPs -  Physician Assistants and Nurse Practitioners) who all work together to provide you with the care you need, when you need it. You will need a follow up appointment in 3 months.  Please call our office 2 months in advance to schedule this appointment.  You may see Dr. Cristal Deerhristopher or one of the following  Advanced Practice Providers on your designated Care Team:   Theodore DemarkRhonda Barrett, PA-C . Joni ReiningKathryn Lawrence, DNP, ANP  Please arrive at the [facility] main entrance of [facility] at xx:xx AM (30-45 minutes prior to test start time)  [facility]  [address] [place], [place] [address] [phone]  Proceed to the [facility] Radiology Department (First Floor).  Please follow these instructions carefully (unless otherwise directed):  On the Night Before the Test: . Be sure to Drink plenty of water. . Do not consume any  caffeinated/decaffeinated beverages or chocolate 12 hours prior to your test. . Do not take any antihistamines 12 hours prior to your test. . If you take Metformin do not take 24 hours prior to test. . If the patient has contrast allergy: ? Patient will need a prescription for Prednisone and very clear instructions (as follows): 1. Prednisone 50 mg - take 13 hours prior to test 2. Take another Prednisone 50 mg 7 hours prior to test 3. Take another Prednisone 50 mg 1 hour prior to test 4. Take Benadryl 50 mg 1 hour prior to test . Patient must complete all four doses of above prophylactic medications. . Patient will need a ride after test due to Benadryl.  On the Day of the Test: . Drink plenty of water. Do not drink any water within one hour of the test. . Do not eat any food 4 hours prior to the test. . You may take your regular medications prior to the test.  . Take metoprolol (Lopressor) two hours prior to test. . HOLD Furosemide/Hydrochlorothiazide morning of the test.       After the Test: . Drink plenty of water. . After receiving IV contrast, you may experience a mild flushed feeling. This is normal. . On occasion, you may experience a mild rash up to 24 hours after the test. This is not dangerous. If this occurs, you can take Benadryl 25 mg and increase your fluid intake. . If you experience trouble breathing, this can be serious. If it  is severe call 911 IMMEDIATELY. If it is mild, please call our office. . If you take any of these medications: Glipizide/Metformin, Avandament, Glucavance, please do not take 48 hours after completing test.      Signed, Jodelle RedBridgette Parag Dorton, MD PhD 05/04/2019  [facility]

## 2019-05-04 NOTE — Patient Instructions (Addendum)
Medication Instructions:  Your Physician recommend you continue on your current medication as directed.    If you need a refill on your cardiac medications before your next appointment, please call your pharmacy.   Lab work: Your physician recommends that you return for lab work 1 week prior to procedure   Testing/Procedures: Your physician has requested that you have an echocardiogram. Echocardiography is a painless test that uses sound waves to create images of your heart. It provides your doctor with information about the size and shape of your heart and how well your heart's chambers and valves are working. This procedure takes approximately one hour. There are no restrictions for this procedure. [address]  Your physician has requested that you have cardiac CT. Cardiac computed tomography (CT) is a painless test that uses an x-ray machine to take clear, detailed pictures of your heart. For further information please visit [email or web address]. Please follow instruction sheet as given. [facility]  Our physician has recommended that you wear an 7  DAY ZIO-PATCH monitor. The Zio patch cardiac monitor continuously records heart rhythm data for up to 14 days, this is for patients being evaluated for multiple types heart rhythms. For the first 24 hours post application, please avoid getting the Zio monitor wet in the shower or by excessive sweating during exercise. After that, feel free to carry on with regular activities. Keep soaps and lotions away from the ZIO XT Patch.   This will be placed at our [facility] location - [address], [address].        Follow-Up: At [facility], you and your health needs are our priority.  As part of our continuing mission to provide you with exceptional heart care, we have created designated Provider Care Teams.  These Care Teams include your primary Cardiologist (physician) and Advanced Practice Providers (APPs -   Physician Assistants and Nurse Practitioners) who all work together to provide you with the care you need, when you need it. You will need a follow up appointment in 3 months.  Please call our office 2 months in advance to schedule this appointment.  You may see Dr. Cristal Deerhristopher or one of the following Advanced Practice Providers on your designated Care Team:   Theodore DemarkRhonda Barrett, PA-C . Joni ReiningKathryn Lawrence, DNP, ANP  Please arrive at the [facility] main entrance of [facility] at xx:xx AM (30-45 minutes prior to test start time)  [facility] [address] [place], [place] [address] [phone]  Proceed to the [facility] Radiology Department (First Floor).  Please follow these instructions carefully (unless otherwise directed):  On the Night Before the Test: . Be sure to Drink plenty of water. . Do not consume any caffeinated/decaffeinated beverages or chocolate 12 hours prior to your test. . Do not take any antihistamines 12 hours prior to your test. . If you take Metformin do not take 24 hours prior to test. . If the patient has contrast allergy: ? Patient will need a prescription for Prednisone and very clear instructions (as follows): 1. Prednisone 50 mg - take 13 hours prior to test 2. Take another Prednisone 50 mg 7 hours prior to test 3. Take another Prednisone 50 mg 1 hour prior to test 4. Take Benadryl 50 mg 1 hour prior to test . Patient must complete all four doses of above prophylactic medications. . Patient will need a ride after test due to Benadryl.  On the Day of  the Test: . Drink plenty of water. Do not drink any water within one hour of the test. . Do not eat any food 4 hours prior to the test. . You may take your regular medications prior to the test.  . Take metoprolol (Lopressor) two hours prior to test. . HOLD Furosemide/Hydrochlorothiazide morning of the test.       After the Test: . Drink plenty of water. . After receiving IV contrast,  you may experience a mild flushed feeling. This is normal. . On occasion, you may experience a mild rash up to 24 hours after the test. This is not dangerous. If this occurs, you can take Benadryl 25 mg and increase your fluid intake. . If you experience trouble breathing, this can be serious. If it is severe call 911 IMMEDIATELY. If it is mild, please call our office. . If you take any of these medications: Glipizide/Metformin, Avandament, Glucavance, please do not take 48 hours after completing test.

## 2019-05-07 ENCOUNTER — Telehealth: Payer: Self-pay | Admitting: *Deleted

## 2019-05-07 NOTE — Telephone Encounter (Signed)
7 day ZIO XT long term holter monitor to be mailed to patients home.  Instructions reviewed briefly as they are included in the monitor kit. 

## 2019-05-10 ENCOUNTER — Ambulatory Visit (INDEPENDENT_AMBULATORY_CARE_PROVIDER_SITE_OTHER): Payer: BC Managed Care – PPO

## 2019-05-10 DIAGNOSIS — R000 Tachycardia, unspecified: Secondary | ICD-10-CM

## 2019-05-15 ENCOUNTER — Telehealth: Payer: BC Managed Care – PPO | Admitting: Cardiology

## 2019-05-16 ENCOUNTER — Other Ambulatory Visit (HOSPITAL_COMMUNITY): Payer: BC Managed Care – PPO

## 2019-05-17 ENCOUNTER — Ambulatory Visit (HOSPITAL_COMMUNITY): Payer: BC Managed Care – PPO | Attending: Cardiovascular Disease

## 2019-05-17 ENCOUNTER — Other Ambulatory Visit: Payer: Self-pay

## 2019-05-17 DIAGNOSIS — R072 Precordial pain: Secondary | ICD-10-CM | POA: Diagnosis present

## 2019-05-17 LAB — BASIC METABOLIC PANEL
BUN/Creatinine Ratio: 6 — ABNORMAL LOW (ref 9–23)
BUN: 5 mg/dL — ABNORMAL LOW (ref 6–24)
CO2: 21 mmol/L (ref 20–29)
Calcium: 9 mg/dL (ref 8.7–10.2)
Chloride: 106 mmol/L (ref 96–106)
Creatinine, Ser: 0.88 mg/dL (ref 0.57–1.00)
GFR calc Af Amer: 92 mL/min/{1.73_m2} (ref >59)
GFR calc non Af Amer: 80 mL/min/{1.73_m2} (ref >59)
Glucose: 76 mg/dL (ref 65–99)
Potassium: 3.4 mmol/L — ABNORMAL LOW (ref 3.5–5.2)
Sodium: 140 mmol/L (ref 134–144)

## 2019-05-18 ENCOUNTER — Encounter: Payer: Self-pay | Admitting: Cardiology

## 2019-05-22 ENCOUNTER — Telehealth (HOSPITAL_COMMUNITY): Payer: Self-pay | Admitting: Emergency Medicine

## 2019-05-22 NOTE — Telephone Encounter (Signed)
Reaching out to patient to offer assistance regarding upcoming cardiac imaging study; pt verbalizes understanding of appt date/time, parking situation and where to check in, pre-test NPO status and medications ordered, and verified current allergies; name and call back number provided for further questions should they arise Egan Berkheimer RN Navigator Cardiac Imaging [facility] [phone] office [phone] cell  Pt denies covid symptoms, verbalized understanding of visitor policy. 

## 2019-05-23 ENCOUNTER — Other Ambulatory Visit: Payer: Self-pay

## 2019-05-23 ENCOUNTER — Encounter (HOSPITAL_COMMUNITY): Payer: Self-pay

## 2019-05-23 ENCOUNTER — Encounter: Payer: BC Managed Care – PPO | Admitting: *Deleted

## 2019-05-23 ENCOUNTER — Telehealth: Payer: Self-pay | Admitting: Cardiology

## 2019-05-23 ENCOUNTER — Ambulatory Visit (HOSPITAL_COMMUNITY)
Admission: RE | Admit: 2019-05-23 | Discharge: 2019-05-23 | Disposition: A | Discharge status: Home / Self Care | Payer: BC Managed Care – PPO | Source: Ambulatory Visit | Attending: Cardiology | Admitting: Cardiology

## 2019-05-23 DIAGNOSIS — Z006 Encounter for examination for normal comparison and control in clinical research program: Secondary | ICD-10-CM

## 2019-05-23 DIAGNOSIS — R072 Precordial pain: Secondary | ICD-10-CM | POA: Diagnosis present

## 2019-05-23 DIAGNOSIS — R079 Chest pain, unspecified: Secondary | ICD-10-CM

## 2019-05-23 MED ORDER — NITROGLYCERIN 0.4 MG SL SUBL: 0.8000 mg | SUBLINGUAL_TABLET | Freq: Once | SUBLINGUAL | Status: DC

## 2019-05-23 MED ORDER — METOPROLOL TARTRATE 5 MG/5ML IV SOLN
INTRAVENOUS | Status: AC
  Administered 2019-05-23: 5 mg via INTRAVENOUS
  Filled 2019-05-23: qty 10

## 2019-05-23 MED ORDER — METOPROLOL TARTRATE 5 MG/5ML IV SOLN
INTRAVENOUS | Status: AC
  Filled 2019-05-23: qty 10

## 2019-05-23 MED ORDER — METOPROLOL TARTRATE 5 MG/5ML IV SOLN
INTRAVENOUS | Status: AC
  Administered 2019-05-23: 10 mg via INTRAVENOUS
  Filled 2019-05-23: qty 10

## 2019-05-23 MED ORDER — DILTIAZEM HCL 25 MG/5ML IV SOLN
INTRAVENOUS | Status: AC
  Administered 2019-05-23: 10 mg via INTRAVENOUS
  Filled 2019-05-23: qty 5

## 2019-05-23 MED ORDER — DILTIAZEM HCL 25 MG/5ML IV SOLN
10.0000 mg | Freq: Once | INTRAVENOUS | Status: AC
  Administered 2019-05-23: 13:00:00 10 mg via INTRAVENOUS

## 2019-05-23 MED ORDER — METOPROLOL TARTRATE 5 MG/5ML IV SOLN
10.0000 mg | INTRAVENOUS | Status: AC | PRN
  Administered 2019-05-23 (×2): 10 mg via INTRAVENOUS
  Administered 2019-05-23: 13:00:00 5 mg via INTRAVENOUS

## 2019-05-23 NOTE — Telephone Encounter (Signed)
New Message    Patient was unable to get CT done due to taking weight loss meds and they were unable to bring HR done please call patient back she would like to speak to a nurse.

## 2019-05-23 NOTE — Progress Notes (Signed)
Notified patient that unable to proceed with the exam as HR is elevated.  Pt to call cardiologist to get exam reordered and rescheduled.  PIV removed and dressing applied.  All questions answered.  Pt discharged

## 2019-05-23 NOTE — Progress Notes (Signed)
Notified Dr. Aundra Dubin of patients HR 77-80 after 20mg  IV metoprolol.  Orders received for cardizem IV 10mg  and if HR is still elevated, 10mg  metoprolol IV.  Will cont to monitor

## 2019-05-23 NOTE — Research (Signed)
Subject Name: [name]  Subject met inclusion and exclusion criteria.  The informed consent form, study requirements and expectations were reviewed with the subject and questions and concerns were addressed prior to the signing of the consent form.  The subject verbalized understanding of the trial requirements.  The subject agreed to participate in the CADFEM G4 trial and signed the informed consent at 1135 on07/15/20.  The informed consent was obtained prior to performance of any protocol-specific procedures for the subject.  A copy of the signed informed consent was given to the subject and a copy was placed in the subject's medical record.   [name]

## 2019-05-23 NOTE — Progress Notes (Signed)
Page returned by Dr. Aundra Dubin.  Notified Dr. Aundra Dubin of change in cardiac rhythm, HR and that PR interval appears to be longer.  Patient currently in NSR, HR mid 70's, asymptomatic.  Notified Dr. Aundra Dubin that patient took weight loss medication this AM.  Dr. Aundra Dubin stated to cancel the exam and to have the patient call cardiologist to reschedule the exam.

## 2019-05-23 NOTE — Telephone Encounter (Signed)
Returned call to patient of Dr. Harrell Gave who was unable to complete coronary CT study  BP 144/108 on arrival - HR 78 BP 138/102 at end of CT appointment - HR 78 They administered metoprolol 25mg , cardizem 10mg  - unable to bring HR down  She would like Dr. Harrell Gave to advise if she should be r/s for test or have a different test

## 2019-05-23 NOTE — Progress Notes (Signed)
As this RN was administering metoprolol 10mg , cardiac rhythm showed missed beat and HR decreased to 60.  Administration of metoprolol stopped at 5mg  after witnessing change in cardiac rhythm.  Pt asymptomatic.  Cardiac rhythm showed a missed beat about every 10 secs which would decrease HR to low 60's.  Then cardiac rhythm returned to normal and HR would be mid 70's.  Paged Dr. Aundra Dubin.

## 2019-05-23 NOTE — Discharge Instructions (Signed)
Testing With IV Contrast Material °IV contrast material is a fluid that is used with some imaging tests. It is injected into your body through a vein. Contrast material is used when your health care providers need a detailed look at organs, tissues, or blood vessels that may not show up with the standard test. The material may be used when an X-ray, an MRI, a CT scan, or an ultrasound is done. °IV contrast material may be used for imaging tests that check: °· Muscles, skin, and fat. °· Breasts. °· Brain. °· Digestive tract. °· Heart. °· Organs such as the liver, kidneys, lungs, bladder, and many others. °· Arteries and veins. °Tell a health care provider about: °· Any allergies you have, especially an allergy to contrast material. °· All medicines you are taking, including metformin, beta blockers, NSAIDs (such as ibuprofen), interleukin-2, vitamins, herbs, eye drops, creams, and over-the-counter medicines. °· Any problems you or family members have had with the use of contrast material. °· Any blood disorders you have, such as sickle cell anemia. °· Any surgeries you have had. °· Any medical conditions you have or have had, especially alcohol abuse, dehydration, asthma, or kidney, liver, or heart problems. °· Whether you are pregnant or may be pregnant. °· Whether you are breastfeeding. Most contrast materials are safe for use in breastfeeding women. °What are the risks? °Generally, this is a safe procedure. However, problems may occur, including: °· Headache. °· Itching, skin rash, and hives. °· Nausea and vomiting. °· Allergic reactions. °· Wheezing or difficulty breathing. °· Abnormal heart rate. °· Changes in blood pressure. °· Throat swelling. °· Kidney damage. °What happens before the procedure? °Medicines °Ask your health care provider about: °· Changing or stopping your regular medicines. This is especially important if you are taking diabetes medicines or blood thinners. °· Taking medicines such as aspirin  and ibuprofen. These medicines can thin your blood. Do not take these medicines unless your health care provider tells you to take them. °· Taking over-the-counter medicines, vitamins, herbs, and supplements. °If you are at risk of having a reaction to the IV contrast material, you may be asked to take medicine before the procedure to prevent a reaction. °General instructions °· Follow instructions from your health care provider about eating or drinking restrictions. °· You may have an exam or lab tests to make sure that you can safely get IV contrast material. °· Ask if you will be given a medicine to help you relax (sedative) during the procedure. If so, plan to have someone take you home from the hospital or clinic. °What happens during the procedure? °· You may be given a sedative to help you relax. °· An IV will be inserted into one of your veins. °· Contrast material will be injected into your IV. °· You may feel warmth or flushing as the contrast material enters your bloodstream. °· You may have a metallic taste in your mouth for a few minutes. °· The needle may cause some discomfort and bruising. °· After the contrast material is in your body, the imaging test will be done. °The procedure may vary among health care providers and hospitals. °What can I expect after the procedure? °· The IV will be removed. °· You may be taken to a recovery area if sedation medicines were used. Your blood pressure, heart rate, breathing rate, and blood oxygen level will be monitored until you leave the hospital or clinic. °Follow these instructions at home: ° °· Take over-the-counter and   prescription medicines only as told by your health care provider. °? Your health care provider may tell you to not take certain medicines for a couple of days after the procedure. This is especially important if you are taking diabetes medicines. °· If you are told, drink enough fluid to keep your urine pale yellow. This will help to remove  the contrast material out of your body. °· Do not drive for 24 hours if you were given a sedative during your procedure. °· It is up to you to get the results of your procedure. Ask your health care provider, or the department that is doing the procedure, when your results will be ready. °· Keep all follow-up visits as told by your health care provider. This is important. °Contact a health care provider if: °· You have redness, swelling, or pain near your IV site. °Get help right away if: °· You have an abnormal heart rhythm. °· You have trouble breathing. °· You have: °? Chest pain. °? Pain in your back, neck, arm, jaw, or stomach. °? Nausea or sweating. °? Hives or a rash. °· You start shaking and cannot stop. °These symptoms may represent a serious problem that is an emergency. Do not wait to see if the symptoms will go away. Get medical help right away. Call your local emergency services (911 in the [place] Do not drive yourself to the hospital. °Summary °· IV contrast material may be used for imaging tests to help your health care providers see your organs and tissues more clearly. °· Tell your health care provider if you are pregnant or may be pregnant. °· During the procedure, you may feel warmth or flushing as the contrast material enters your bloodstream. °· After the procedure, drink enough fluid to keep your urine pale yellow. °This information is not intended to replace advice given to you by your health care provider. Make sure you discuss any questions you have with your health care provider. °Document Released: 10/13/2009 Document Revised: 01/11/2019 Document Reviewed: 01/11/2019 °Elsevier Patient Education © 2020 [employer] ° ° °Cardiac CT Angiogram ° °A cardiac CT angiogram is a procedure to look at the heart and the area around the heart. It may be done to help find the cause of chest pains or other symptoms of heart disease. During this procedure, a large X-ray machine, called a CT scanner,  takes detailed pictures of the heart and the surrounding area after a dye (contrast material) has been injected into blood vessels in the area. The procedure is also sometimes called a coronary CT angiogram, coronary artery scanning, or CTA. °A cardiac CT angiogram allows the health care provider to see how well blood is flowing to and from the heart. The health care provider will be able to see if there are any problems, such as: °· Blockage or narrowing of the coronary arteries in the heart. °· Fluid around the heart. °· Signs of weakness or disease in the muscles, valves, and tissues of the heart. °Tell a health care provider about: °· Any allergies you have. This is especially important if you have had a previous allergic reaction to contrast dye. °· All medicines you are taking, including vitamins, herbs, eye drops, creams, and over-the-counter medicines. °· Any blood disorders you have. °· Any surgeries you have had. °· Any medical conditions you have. °· Whether you are pregnant or may be pregnant. °· Any anxiety disorders, chronic pain, or other conditions you have that may increase your stress or prevent   you from lying still. °What are the risks? °Generally, this is a safe procedure. However, problems may occur, including: °· Bleeding. °· Infection. °· Allergic reactions to medicines or dyes. °· Damage to other structures or organs. °· Kidney damage from the dye or contrast that is used. °· Increased risk of cancer from radiation exposure. This risk is low. Talk with your health care provider about: °? The risks and benefits of testing. °? How you can receive the lowest dose of radiation. °What happens before the procedure? °· Wear comfortable clothing and remove any jewelry, glasses, dentures, and hearing aids. °· Follow instructions from your health care provider about eating and drinking. This may include: °? For 12 hours before the test -- avoid caffeine. This includes tea, coffee, soda, energy drinks,  and diet pills. Drink plenty of water or other fluids that do not have caffeine in them. Being well-hydrated can prevent complications. °? For 4-6 hours before the test -- stop eating and drinking. The contrast dye can cause nausea, but this is less likely if your stomach is empty. °· Ask your health care provider about changing or stopping your regular medicines. This is especially important if you are taking diabetes medicines, blood thinners, or medicines to treat erectile dysfunction. °What happens during the procedure? °· Hair on your chest may need to be removed so that small sticky patches called electrodes can be placed on your chest. These will transmit information that helps to monitor your heart during the test. °· An IV tube will be inserted into one of your veins. °· You might be given a medicine to control your heart rate during the test. This will help to ensure that good images are obtained. °· You will be asked to lie on an exam table. This table will slide in and out of the CT machine during the procedure. °· Contrast dye will be injected into the IV tube. You might feel warm, or you may get a metallic taste in your mouth. °· You will be given a medicine (nitroglycerin) to relax (dilate) the arteries in your heart. °· The table that you are lying on will move into the CT machine tunnel for the scan. °· The person running the machine will give you instructions while the scans are being done. You may be asked to: °? Keep your arms above your head. °? Hold your breath. °? Stay very still, even if the table is moving. °· When the scanning is complete, you will be moved out of the machine. °· The IV tube will be removed. °The procedure may vary among health care providers and hospitals. °What happens after the procedure? °· You might feel warm, or you may get a metallic taste in your mouth from the contrast dye. °· You may have a headache from the nitroglycerin. °· After the procedure, drink water or  other fluids to wash (flush) the contrast material out of your body. °· Contact a health care provider if you have any symptoms of allergy to the contrast. These symptoms include: °? Shortness of breath. °? Rash or hives. °? A racing heartbeat. °· Most people can return to their normal activities right after the procedure. Ask your health care provider what activities are safe for you. °· It is up to you to get the results of your procedure. Ask your health care provider, or the department that is doing the procedure, when your results will be ready. °Summary °· A cardiac CT angiogram is a procedure to   look at the heart and the area around the heart. It may be done to help find the cause of chest pains or other symptoms of heart disease. °· During this procedure, a large X-ray machine, called a CT scanner, takes detailed pictures of the heart and the surrounding area after a dye (contrast material) has been injected into blood vessels in the area. °· Ask your health care provider about changing or stopping your regular medicines before the procedure. This is especially important if you are taking diabetes medicines, blood thinners, or medicines to treat erectile dysfunction. °· After the procedure, drink water or other fluids to wash (flush) the contrast material out of your body. °This information is not intended to replace advice given to you by your health care provider. Make sure you discuss any questions you have with your health care provider. °Document Released: 10/07/2008 Document Revised: 10/07/2017 Document Reviewed: 09/13/2016 °Elsevier Patient Education © 2020 Else[employer] Inc. ° °

## 2019-05-24 NOTE — Telephone Encounter (Signed)
Pt updated and voiced understanding. Testing orders placed and instruction provided to pt. Message sent to schedulers to arrange scheduling date.

## 2019-05-24 NOTE — Addendum Note (Signed)
Addended by: Meryl Crutch on: 05/24/2019 04:24 PM   Modules accepted: Orders

## 2019-05-24 NOTE — Telephone Encounter (Signed)
I think we can try a different test. We are now doing treadmill tests in the office, and this does not need the heart rate to be slow (in fact, we want the heart rate to be able to rise with exercise). Has she done the monitor yet? Her echo is normal, which is reassuring. Thank you.

## 2019-05-25 ENCOUNTER — Telehealth: Payer: Self-pay

## 2019-05-25 ENCOUNTER — Encounter: Payer: Self-pay | Admitting: Cardiology

## 2019-05-25 ENCOUNTER — Other Ambulatory Visit: Payer: Self-pay

## 2019-05-25 NOTE — Telephone Encounter (Signed)
Pt scheduled for pre-procedural COVID test on 06/09/19 at 12:40 am. Pt made aware.

## 2019-06-08 ENCOUNTER — Telehealth (HOSPITAL_COMMUNITY): Payer: Self-pay | Admitting: *Deleted

## 2019-06-08 NOTE — Telephone Encounter (Signed)
Close encounter 

## 2019-06-09 ENCOUNTER — Other Ambulatory Visit (HOSPITAL_COMMUNITY)
Admission: RE | Admit: 2019-06-09 | Discharge: 2019-06-09 | Disposition: A | Discharge status: Home / Self Care | Payer: BC Managed Care – PPO | Source: Ambulatory Visit | Attending: Cardiology | Admitting: Cardiology

## 2019-06-09 DIAGNOSIS — Z01812 Encounter for preprocedural laboratory examination: Secondary | ICD-10-CM | POA: Insufficient documentation

## 2019-06-09 DIAGNOSIS — Z20828 Contact with and (suspected) exposure to other viral communicable diseases: Secondary | ICD-10-CM | POA: Insufficient documentation

## 2019-06-09 LAB — SARS CORONAVIRUS 2 (TAT 6-24 HRS): SARS Coronavirus 2: NEGATIVE

## 2019-06-13 ENCOUNTER — Ambulatory Visit (HOSPITAL_COMMUNITY)
Admission: RE | Admit: 2019-06-13 | Discharge: 2019-06-13 | Disposition: A | Discharge status: Home / Self Care | Payer: BC Managed Care – PPO | Source: Ambulatory Visit | Attending: Cardiology | Admitting: Cardiology

## 2019-06-13 ENCOUNTER — Other Ambulatory Visit: Payer: Self-pay

## 2019-06-13 DIAGNOSIS — R079 Chest pain, unspecified: Secondary | ICD-10-CM

## 2019-06-13 LAB — EXERCISE TOLERANCE TEST
Estimated workload: 10.1 METS
Exercise duration (min): 8 min
Exercise duration (sec): 3 s
MPHR: 176 {beats}/min
Peak HR: 179 {beats}/min
Percent HR: 101 %
Rest HR: 108 {beats}/min

## 2019-07-26 ENCOUNTER — Ambulatory Visit: Payer: BC Managed Care – PPO | Admitting: Cardiology

## 2020-01-05 ENCOUNTER — Ambulatory Visit: Payer: BC Managed Care – PPO | Attending: Internal Medicine

## 2020-03-03 ENCOUNTER — Encounter: Payer: Self-pay | Admitting: Gastroenterology

## 2020-04-03 ENCOUNTER — Encounter: Payer: Self-pay | Admitting: Gastroenterology

## 2020-04-03 ENCOUNTER — Ambulatory Visit (INDEPENDENT_AMBULATORY_CARE_PROVIDER_SITE_OTHER): Payer: BC Managed Care – PPO | Admitting: Gastroenterology

## 2020-04-03 VITALS — BP 122/90 | HR 72 | Ht 67.0 in | Wt 174.0 lb

## 2020-04-03 DIAGNOSIS — K625 Hemorrhage of anus and rectum: Secondary | ICD-10-CM | POA: Diagnosis not present

## 2020-04-03 DIAGNOSIS — K529 Noninfective gastroenteritis and colitis, unspecified: Secondary | ICD-10-CM | POA: Diagnosis not present

## 2020-04-03 MED ORDER — HYOSCYAMINE SULFATE 0.125 MG SL SUBL: 0.1250 mg | SUBLINGUAL_TABLET | Freq: Four times a day (QID) | SUBLINGUAL | 1 refills | Status: DC | PRN

## 2020-04-03 MED ORDER — SUTAB 1479-225-188 MG PO TABS: ORAL_TABLET | ORAL | 0 refills | Status: DC

## 2020-04-03 NOTE — Patient Instructions (Signed)
If you are age 46 or older, your body mass index should be between 23-30. Your Body mass index is 27.25 kg/m. If this is out of the aforementioned range listed, please consider follow up with your Primary Care Provider.  If you are age 70 or younger, your body mass index should be between 19-25. Your Body mass index is 27.25 kg/m. If this is out of the aformentioned range listed, please consider follow up with your Primary Care Provider.   You have been scheduled for a colonoscopy. Please follow written instructions given to you at your visit today.  Please pick up your prep supplies at the pharmacy within the next 1-3 days. If you use inhalers (even only as needed), please bring them with you on the day of your procedure.  It was a pleasure to see you today!  Dr. Myrtie Neither

## 2020-04-03 NOTE — Progress Notes (Addendum)
[facility] Consult Note:  History: [name] 04/03/2020  Referring provider: [name], Carlean Jews.Marlou Sa, MD  Reason for consult/chief complaint: Blood In Stools (Patient has active bleeding from hemorrhoids, also constipation alternating with diarrhea)   Subjective  HPI:  This is a very pleasant 44 year old woman referred by primary care for altered bowel habits and rectal bleeding.  She reports perhaps a couple years of alternating between constipation and diarrhea, problem which seems worse in about the last 6 months.  It seems to primarily be 2-3 semiformed to loose stools per day, for which she will take Imodium and then have no BM for couple of days before diarrhea recurs.  Sometimes she will have constipation without taking Imodium.  She has had increasing painless rectal bleeding over the last 6 months which she believes due to hemorrhoids, and feels like sometimes there is tissue protruding from the rectum when this occurs.  It seems to behave similar to when she had some bleeding and seen by Dr. Sharlett Iles many years ago. The niece also tells me she saw a surgeon (?  [facility]) last year who recommended surgery for the hemorrhoids but she did not return because she did not feel there was a good rapport.  Colonoscopy Sharlett Iles 01/2007 - reportedly hemorrhoids - difficult to tell from photo ROS:  Review of Systems  Constitutional: Negative for appetite change and unexpected weight change.  HENT: Negative for mouth sores and voice change.   Eyes: Negative for pain and redness.  Respiratory: Negative for cough and shortness of breath.   Cardiovascular: Negative for chest pain and palpitations.  Gastrointestinal:       Years of intermittent heartburn that has been under good control for about the last 6 months on daily pantoprazole.  Prior to that she had taken it as needed.  She denies dysphagia, odynophagia, nausea vomiting early satiety or weight loss.    Genitourinary: Negative for dysuria and hematuria.  Musculoskeletal: Negative for arthralgias and myalgias.  Skin: Negative for pallor and rash.  Neurological: Negative for weakness and headaches.  Hematological: Negative for adenopathy.   Diarrhea predated the use of pantoprazole and Saxenda  Past Medical History: History reviewed. No pertinent past medical history. No chronic medical problems  Past Surgical History: History reviewed. No pertinent surgical history. Cesarean section  Family History: History reviewed. No pertinent family history. No known colorectal cancer or inflammatory bowel disease  social History: Social History   Socioeconomic History  . Marital status: Married    Spouse name: Not on file  . Number of children: 2  . Years of education: Not on file  . Highest education level: Not on file  Occupational History  . Not on file  Tobacco Use  . Smoking status: Never Smoker  . Smokeless tobacco: Never Used  Substance and Sexual Activity  . Alcohol use: Yes    Alcohol/week: 1.0 standard drinks    Types: 1 Glasses of wine per week  . Drug use: Never  . Sexual activity: Yes    Birth control/protection: I.U.D.  Other Topics Concern  . Not on file  Social History Narrative  . Not on file   Social Determinants of Health   Financial Resource Strain:   . Difficulty of Paying Living Expenses:   Food Insecurity:   . Worried About Charity fundraiser in the Last Year:   . Arboriculturist in the Last Year:   Transportation Needs:   . Film/video editor (Medical):   Marland Kitchen  Lack of Transportation (Non-Medical):   Physical Activity:   . Days of Exercise per Week:   . Minutes of Exercise per Session:   Stress:   . Feeling of Stress :   Social Connections:   . Frequency of Communication with Friends and Family:   . Frequency of Social Gatherings with Friends and Family:   . Attends Religious Services:   . Active Member of Clubs or Organizations:   .  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 Penicillins     Outpatient Meds: Current Outpatient Medications  Medication Sig Dispense Refill  . Liraglutide -Weight Management (SAXENDA [place]) Inject 1.8 Units into the skin daily.    . pantoprazole (PROTONIX) 40 MG tablet Take 40 mg by mouth daily.    . Sodium Sulfate-Mag Sulfate-KCl (SUTAB) [phone] MG TABS Please use as directed by physician   BIN: [id number] PCN: CN GROUP: [id number] MEMBER ID: [id number];GHW AS CASH 12 tablet 0   No current facility-administered medications for this visit.      ___________________________________________________________________ Objective   Exam:  BP 122/90   Pulse 72   Ht 5\' 7"  (1.702 m)   Wt 174 lb (78.9 kg)   BMI 27.25 kg/m    General: Well-appearing, pleasant and conversational.  Eyes: sclera anicteric, no redness  ENT: oral mucosa moist without lesions, no cervical or supraclavicular lymphadenopathy  CV: RRR without murmur, S1/S2, no JVD, no peripheral edema  Resp: clear to auscultation bilaterally, normal RR and effort noted  GI: soft, no tenderness, with active bowel sounds. No guarding or palpable organomegaly noted.  Skin; warm and dry, no rash or jaundice noted  Neuro: awake, alert and oriented x 3. Normal gross motor function and fluent speech Rectal: Normal external, normal sphincter tone, no fissure or tenderness.  No palpable internal lesion. Labs:  No recent data for review.  Assessment: Encounter Diagnoses  Name Primary?  . Rectal bleed Yes  . Chronic diarrhea     Most likely hemorrhoidal bleeding, perhaps exacerbated by altered bowel habits.  It has an IBS-like sound, primarily diarrhea as near as I can tell.  Consider IBD or microscopic colitis.  Plan:  Colonoscopy to evaluate bleeding and diarrhea.  Random biopsies to rule out microscopic colitis. She  was agreeable after discussion of procedure and risks.  The benefits and risks of the planned procedure were described in detail with the patient or (when appropriate) their health care proxy.  Risks were outlined as including, but not limited to, bleeding, infection, perforation, adverse medication reaction leading to cardiac or pulmonary decompensation, pancreatitis (if ERCP).  The limitation of incomplete mucosal visualization was also discussed.  No guarantees or warranties were given.  Trial of Levsin 0.125 mg twice daily We will request the office note from [facility].  Thank you for the courtesy of this consult.  Please call me with any questions or concerns.  III  CC: Referring provider noted above  Record review addendum:   Office consult note from Dr. Charlie Pitter dated 04/23/19 re: prolapsing and symptomatic internal hemorrhoids, for which surgery was offered.  - HD

## 2020-04-11 ENCOUNTER — Telehealth: Payer: Self-pay | Admitting: Gastroenterology

## 2020-04-11 NOTE — Telephone Encounter (Signed)
Called pt, unable to leave voicemail

## 2020-04-11 NOTE — Telephone Encounter (Signed)
Chronic problem for patient.  Colonoscopy scheduled to investigate irregular bowel habits.  Gently push hemorrhoid tissue back up when they prolapse after BMs.  OTC preparation H suppository (with phenylephrine - does not need hydrocortisone) One suppository every 8 hours as needed.  I got the note from Dr. Michaell Cowing who she saw at [facility] for this last year.  After the colonoscopy, she will most likely need surgical attention for these, but she requested a different surgeon.  We can make those arrangements after the colonoscopy on June 16th.  - HD

## 2020-04-11 NOTE — Telephone Encounter (Signed)
Pt states yesterday she had bad hemorrhoids develop, reports they are very painful and bleeding. Pt is requesting medication be called in for them. Please advise.

## 2020-04-14 NOTE — Telephone Encounter (Signed)
Spoke with patient regarding recommendations, pt reminded of Colonoscopy appointment on 04/23/20 at 2 pm  pt advised to call back with any other questions or concerns.

## 2020-04-17 ENCOUNTER — Encounter: Payer: Self-pay | Admitting: Gastroenterology

## 2020-04-22 ENCOUNTER — Encounter: Payer: Self-pay | Admitting: Certified Registered Nurse Anesthetist

## 2020-04-23 ENCOUNTER — Other Ambulatory Visit: Payer: Self-pay

## 2020-04-23 ENCOUNTER — Encounter: Payer: Self-pay | Admitting: Gastroenterology

## 2020-04-23 ENCOUNTER — Ambulatory Visit (AMBULATORY_SURGERY_CENTER): Payer: BC Managed Care – PPO | Admitting: Gastroenterology

## 2020-04-23 VITALS — BP 145/100 | HR 90 | Temp 97.1°F | Resp 14 | Ht 67.0 in | Wt 174.0 lb

## 2020-04-23 DIAGNOSIS — R194 Change in bowel habit: Secondary | ICD-10-CM | POA: Diagnosis not present

## 2020-04-23 DIAGNOSIS — K625 Hemorrhage of anus and rectum: Secondary | ICD-10-CM

## 2020-04-23 DIAGNOSIS — Z538 Procedure and treatment not carried out for other reasons: Secondary | ICD-10-CM | POA: Diagnosis not present

## 2020-04-23 MED ORDER — SODIUM CHLORIDE 0.9 % IV SOLN: 500.0000 mL | Freq: Once | INTRAVENOUS | Status: DC

## 2020-04-23 NOTE — Op Note (Signed)
[facility] Patient Name: [name] Procedure Date: 04/23/2020 1:59 PM MRN: [id number] Endoscopist: Sherilyn Cooter L. Myrtie Neither , MD Age: 44 Referring MD:  Date of Birth: [birth year] Gender: Female Account #: [id number] Procedure:                Colonoscopy Indications:              Rectal bleeding, Change in bowel habits                            (alternating constipation/diarrhea) Medicines:                Monitored Anesthesia Care Procedure:                Pre-Anesthesia Assessment:                           - Prior to the procedure, a History and Physical                            was performed, and patient medications and                            allergies were reviewed. The patient's tolerance of                            previous anesthesia was also reviewed. The risks                            and benefits of the procedure and the sedation                            options and risks were discussed with the patient.                            All questions were answered, and informed consent                            was obtained. Prior Anticoagulants: The patient has                            taken no previous anticoagulant or antiplatelet                            agents. ASA Grade Assessment: II - A patient with                            mild systemic disease. After reviewing the risks                            and benefits, the patient was deemed in                            satisfactory condition to undergo the procedure.  After obtaining informed consent, the colonoscope                            was passed under direct vision. Throughout the                            procedure, the patient's blood pressure, pulse, and                            oxygen saturations were monitored continuously. The                            Colonoscope was introduced through the anus with                            the intention of advancing to  the splenic flexure.                            The scope was advanced to the descending colon                            before the procedure was aborted. Medications were                            given. The colonoscopy was performed with                            difficulty due to poor bowel prep. The patient                            tolerated the procedure well. The quality of the                            bowel preparation was poor. The bowel preparation                            used was SUTAB. Scope In: 2:08:14 PM Scope Out: 2:15:50 PM Total Procedure Duration: 0 hours 7 minutes 36 seconds  Findings:                 The perianal and digital rectal examinations were                            normal.                           Solid stool was found in the rectum, in the sigmoid                            colon and in the descending colon, interfering with                            visualization and scope advancement. The procedure  was therefore aborted. The left colon was also                            noted to be redundant, causing scope looping.                           External hemorrhoids were found. The hemorrhoids                            were small.                           Retroflexion in the rectum was not performed due to                            narrow anatomy. No internal hemorrhoids were seen                            on anteflexion. Complications:            No immediate complications. Estimated Blood Loss:     Estimated blood loss: none. Impression:               - Preparation of the colon was poor.                           - Stool in the rectum, in the sigmoid colon and in                            the descending colon.                           - External hemorrhoids.                           - No specimens collected.                           Constipation appears to be the primary problem Recommendation:           -  Patient has a contact number available for                            emergencies. The signs and symptoms of potential                            delayed complications were discussed with the                            patient. Return to normal activities tomorrow.                            Written discharge instructions were provided to the                            patient.                           -  Resume previous diet.                           - Continue present medications.                           - Use Benefiber one teaspoon PO daily.                           - Repeat colonoscopy with 2-day miralax/suprep                            preparation. Within 2-3 months.                           - Discontinue hyoscyamine, as patient reports it                            has not been helpful. [name] L. Loletha Carrow, MD 04/23/2020 2:32:56 PM This report has been signed electronically.

## 2020-04-23 NOTE — Progress Notes (Signed)
Report given to PACU, vss 

## 2020-04-23 NOTE — Patient Instructions (Addendum)
Please read handouts provided. Continue present medications. Use Benefiber one teaspoon daily. Discontinue hyoscyamine. Repeat colonoscopy within 2-3 months.      YOU HAD AN ENDOSCOPIC PROCEDURE TODAY AT THE [facility]:   Refer to the procedure report that was given to you for any specific questions about what was found during the examination.  If the procedure report does not answer your questions, please call your gastroenterologist to clarify.  If you requested that your care partner not be given the details of your procedure findings, then the procedure report has been included in a sealed envelope for you to review at your convenience later.  YOU SHOULD EXPECT: Some feelings of bloating in the abdomen. Passage of more gas than usual.  Walking can help get rid of the air that was put into your GI tract during the procedure and reduce the bloating. If you had a lower endoscopy (such as a colonoscopy or flexible sigmoidoscopy) you may notice spotting of blood in your stool or on the toilet paper. If you underwent a bowel prep for your procedure, you may not have a normal bowel movement for a few days.  Please Note:  You might notice some irritation and congestion in your nose or some drainage.  This is from the oxygen used during your procedure.  There is no need for concern and it should clear up in a day or so.  SYMPTOMS TO REPORT IMMEDIATELY:   Following lower endoscopy (colonoscopy or flexible sigmoidoscopy):  Excessive amounts of blood in the stool  Significant tenderness or worsening of abdominal pains  Swelling of the abdomen that is new, acute  Fever of 100F or higher   For urgent or emergent issues, a gastroenterologist can be reached at any hour by calling [phone]. Do not use MyChart messaging for urgent concerns.    DIET:  We do recommend a small meal at first, but then you may proceed to your regular diet.  Drink plenty of fluids but you should avoid  alcoholic beverages for 24 hours.  ACTIVITY:  You should plan to take it easy for the rest of today and you should NOT DRIVE or use heavy machinery until tomorrow (because of the sedation medicines used during the test).    FOLLOW UP: Our staff will call the number listed on your records 48-72 hours following your procedure to check on you and address any questions or concerns that you may have regarding the information given to you following your procedure. If we do not reach you, we will leave a message.  We will attempt to reach you two times.  During this call, we will ask if you have developed any symptoms of COVID 19. If you develop any symptoms (ie: fever, flu-like symptoms, shortness of breath, cough etc.) before then, please call [phone].  If you test positive for Covid 19 in the 2 weeks post procedure, please call and report this information to [place].    If any biopsies were taken you will be contacted by phone or by letter within the next 1-3 weeks.  Please call us at [phone] if you have not heard about the biopsies in 3 weeks.    SIGNATURES/CONFIDENTIALITY: You and/or your care partner have signed paperwork which will be entered into your electronic medical record.  These signatures attest to the fact that that the information above on your After Visit Summary has been reviewed and is understood.  Full responsibility of the confidentiality of this discharge information  lies with you and/or your care-partner.YOU HAD AN ENDOSCOPIC PROCEDURE TODAY AT [facility]:   Refer to the procedure report that was given to you for any specific questions about what was found during the examination.  If the procedure report does not answer your questions, please call your gastroenterologist to clarify.  If you requested that your care partner not be given the details of your procedure findings, then the procedure report has been included in a sealed envelope for you to review at  your convenience later.  YOU SHOULD EXPECT: Some feelings of bloating in the abdomen. Passage of more gas than usual.  Walking can help get rid of the air that was put into your GI tract during the procedure and reduce the bloating. If you had a lower endoscopy (such as a colonoscopy or flexible sigmoidoscopy) you may notice spotting of blood in your stool or on the toilet paper. If you underwent a bowel prep for your procedure, you may not have a normal bowel movement for a few days.  Please Note:  You might notice some irritation and congestion in your nose or some drainage.  This is from the oxygen used during your procedure.  There is no need for concern and it should clear up in a day or so.  SYMPTOMS TO REPORT IMMEDIATELY:   Following lower endoscopy (colonoscopy or flexible sigmoidoscopy):  Excessive amounts of blood in the stool  Significant tenderness or worsening of abdominal pains  Swelling of the abdomen that is new, acute  Fever of 100F or higher   For urgent or emergent issues, a gastroenterologist can be reached at any hour by calling [phone]. Do not use MyChart messaging for urgent concerns.    DIET:  We do recommend a small meal at first, but then you may proceed to your regular diet.  Drink plenty of fluids but you should avoid alcoholic beverages for 24 hours.  ACTIVITY:  You should plan to take it easy for the rest of today and you should NOT DRIVE or use heavy machinery until tomorrow (because of the sedation medicines used during the test).    FOLLOW UP: Our staff will call the number listed on your records 48-72 hours following your procedure to check on you and address any questions or concerns that you may have regarding the information given to you following your procedure. If we do not reach you, we will leave a message.  We will attempt to reach you two times.  During this call, we will ask if you have developed any symptoms of COVID 19. If you develop any  symptoms (ie: fever, flu-like symptoms, shortness of breath, cough etc.) before then, please call [phone].  If you test positive for Covid 19 in the 2 weeks post procedure, please call and report this information to [place].    If any biopsies were taken you will be contacted by phone or by letter within the next 1-3 weeks.  Please call us at [phone] if you have not heard about the biopsies in 3 weeks.    SIGNATURES/CONFIDENTIALITY: You and/or your care partner have signed paperwork which will be entered into your electronic medical record.  These signatures attest to the fact that that the information above on your After Visit Summary has been reviewed and is understood.  Full responsibility of the confidentiality of this discharge information lies with you and/or your care-partner.

## 2020-04-23 NOTE — Progress Notes (Signed)
1413 BP  184/130     , Labetalol ordered and given IV, MD update, vss

## 2020-04-25 ENCOUNTER — Telehealth: Payer: Self-pay

## 2020-04-25 ENCOUNTER — Telehealth: Payer: Self-pay | Admitting: *Deleted

## 2020-04-25 NOTE — Telephone Encounter (Signed)
  Follow up Call-  Call back number 04/23/2020  Post procedure Call Back phone  # [phone]  Permission to leave phone message Yes  Some recent data might be hidden     Patient questions:  Do you have a fever, pain , or abdominal swelling? No. Pain Score  0 *  Have you tolerated food without any problems? Yes.    Have you been able to return to your normal activities? Yes.    Do you have any questions about your discharge instructions: Diet   No. Medications  No. Follow up visit  No.  Do you have questions or concerns about your Care? No.  Actions: * If pain score is 4 or above: No action needed, pain <4.  1. Have you developed a fever since your procedure? no  2.   Have you had an respiratory symptoms (SOB or cough) since your procedure? no  3.   Have you tested positive for COVID 19 since your procedure no  4.   Have you had any family members/close contacts diagnosed with the COVID 19 since your procedure?  no   If yes to any of these questions please route to Laverna Peace, RN and Charlett Lango, RN

## 2020-04-25 NOTE — Telephone Encounter (Signed)
Follow up scheduled for 06-12-2020  Left a detailed voicemail for the patient. Asked her to call and reschedule if this didn't work for her.  Mailed an appointment reminder sheet to home address on file.

## 2020-04-25 NOTE — Telephone Encounter (Signed)
Called [phone] and left a messaged we tried to reach pt for a follow up call. maw

## 2020-04-25 NOTE — Telephone Encounter (Signed)
-----   Message from Charlie Pitter III, MD sent at 04/23/2020  5:04 PM EDT ----- Regarding: clinic follow up [name],    Please arrange clinic follow with me for constipation and rectal bleeding in about 6 weeks.  Thanks  - HD

## 2020-06-12 ENCOUNTER — Encounter: Payer: Self-pay | Admitting: Gastroenterology

## 2020-06-12 ENCOUNTER — Ambulatory Visit (INDEPENDENT_AMBULATORY_CARE_PROVIDER_SITE_OTHER): Payer: BC Managed Care – PPO | Admitting: Gastroenterology

## 2020-06-12 VITALS — BP 140/100 | HR 94 | Ht 66.0 in | Wt 176.4 lb

## 2020-06-12 DIAGNOSIS — K625 Hemorrhage of anus and rectum: Secondary | ICD-10-CM

## 2020-06-12 DIAGNOSIS — R194 Change in bowel habit: Secondary | ICD-10-CM | POA: Diagnosis not present

## 2020-06-12 NOTE — Patient Instructions (Addendum)
If you are age 46 or older, your body mass index should be between 23-30. Your Body mass index is 28.47 kg/m. If this is out of the aforementioned range listed, please consider follow up with your Primary Care Provider.  If you are age 70 or younger, your body mass index should be between 19-25. Your Body mass index is 28.47 kg/m. If this is out of the aformentioned range listed, please consider follow up with your Primary Care Provider.   You have been scheduled for a colonoscopy. Please follow written instructions given to you at your visit today.  Please pick up your prep supplies at the pharmacy within the next 1-3 days. If you use inhalers (even only as needed), please bring them with you on the day of your procedure.  Stop fiber  Miralax 1/2 capful a day, increase to one capful is needed.   It was a pleasure to see you today!  Dr. Myrtie Neither

## 2020-06-12 NOTE — Progress Notes (Addendum)
     [facility] Progress Note  Chief Complaint: Rectal bleeding  Subjective  History:  [name] was seen in clinic in late May for chronic rectal bleeding with altered bowel habits, constipation/diarrhea.  Records obtained later indicated she had seen Dr. Brett Canales gross for symptomatic prolapse internal hemorrhoids and surgery was offered.  [name] told me at the first visit she did not have a good rapport at that encounter.  She had a colonoscopy with me on June 16, but unfortunately had a very poor prep, with solid stool in the left colon precluding scope passage despite taking a Sutab prep.  She had been given a trial of hyoscyamine at the office visit which she reported was not helpful, so it was discontinued.  Daily fiber supplement and recommended.  [name] is still troubled by irregular bowel habits.  It seems to be primarily constipation, which improved briefly after she started the daily fiber supplement, then went back to how it had been before.  Still gets intermittent urgency with loose stool.  She feels there is tissue protruding with bowel movements and she still has frequent bleeding.  ROS: Cardiovascular:  no chest pain Respiratory: no dyspnea  The patient's Past Medical, Family and Social History were reviewed and are on file in the EMR.  Objective:  Med list reviewed  Current Outpatient Medications:  .  hydrocortisone (ANUSOL-HC) 2.5 % rectal cream, , Disp: , Rfl:  .  tretinoin (RETIN-A) 0.05 % cream, , Disp: , Rfl:  .  valACYclovir (VALTREX) 500 MG tablet, Take 500 mg by mouth daily. , Disp: , Rfl:    Vital signs in last 24 hrs: Vitals:   06/12/20 0821  BP: (!) 140/100  Pulse: 94    Physical Exam   Cardiac: RRR without murmurs, S1S2 heard, no peripheral edema  Pulm: clear to auscultation bilaterally, normal RR and effort noted  Abdomen: soft, no tenderness, with active bowel sounds. No guarding or palpable hepatosplenomegaly. Rectal: Normal perianal  exam.  A few small redundant perianal skin folds.  No protrusion of tissue with Valsalva. Labs:   ___________________________________________ Radiologic studies:   ____________________________________________ Other:   _____________________________________________ Assessment & Plan  Assessment: Encounter Diagnoses  Name Primary?  . Rectal bleeding Yes  . Altered bowel habits    She may be having intermittent hemorrhoidal bleeding, though no hemorrhoids seen on recent procedure.  Perhaps they are episodically becoming swollen and then protruding because of her difficulty with bowel habits.  Given the large amount of retained stool after a bowel preparation, I suspect constipation is the primary issue and she may be having some overflow diarrhea. We need to try and regulate her bowel habits if possible in hopes that may decrease the rectal bleeding.  If not, then further evaluation for either banding or colorectal surgery.  Plan: Stop daily fiber MiraLAX one half capful a day and water, increasing to full capful as needed.  Repeat attempted colonoscopy with 2-day bowel preparation.  The benefits and risks of the planned procedure were described in detail with the patient or (when appropriate) their health care proxy.  Risks were outlined as including, but not limited to, bleeding, infection, perforation, adverse medication reaction leading to cardiac or pulmonary decompensation, pancreatitis (if ERCP).  The limitation of incomplete mucosal visualization was also discussed.  No guarantees or warranties were given.    20 minutes were spent on this encounter (including chart review, history/exam, counseling/coordination of care, and documentation)  Charlie Pitter III

## 2020-06-13 ENCOUNTER — Encounter: Payer: Self-pay | Admitting: Gastroenterology

## 2020-06-23 ENCOUNTER — Encounter: Payer: BC Managed Care – PPO | Admitting: Gastroenterology

## 2020-06-27 ENCOUNTER — Encounter: Payer: BC Managed Care – PPO | Admitting: Gastroenterology

## 2020-06-27 ENCOUNTER — Other Ambulatory Visit: Payer: Self-pay

## 2020-06-27 ENCOUNTER — Ambulatory Visit (AMBULATORY_SURGERY_CENTER): Payer: BC Managed Care – PPO | Admitting: Gastroenterology

## 2020-06-27 ENCOUNTER — Encounter: Payer: Self-pay | Admitting: Gastroenterology

## 2020-06-27 VITALS — BP 154/98 | HR 82 | Temp 97.5°F | Resp 22 | Ht 66.0 in | Wt 176.0 lb

## 2020-06-27 DIAGNOSIS — K5909 Other constipation: Secondary | ICD-10-CM

## 2020-06-27 DIAGNOSIS — K625 Hemorrhage of anus and rectum: Secondary | ICD-10-CM | POA: Diagnosis present

## 2020-06-27 DIAGNOSIS — K5900 Constipation, unspecified: Secondary | ICD-10-CM | POA: Diagnosis not present

## 2020-06-27 MED ORDER — SODIUM CHLORIDE 0.9 % IV SOLN: 500.0000 mL | Freq: Once | INTRAVENOUS | Status: DC

## 2020-06-27 NOTE — Progress Notes (Signed)
pt tolerated well. VSS. awake and to recovery. Report given to RN.  

## 2020-06-27 NOTE — Op Note (Signed)
[facility] Patient Name: [name] Procedure Date: 06/27/2020 11:40 AM MRN: [id number] Endoscopist: Sherilyn Cooter L. Myrtie Neither , MD Age: 44 Referring MD:  Date of Birth: [birth year] Gender: Female Account #: [id number] Procedure:                Colonoscopy Indications:              Rectal bleeding, Constipation (reported history of                            hemprrhoids with surgical consult last year - no HR                            seen on recent office exam. poor prep on recent                            colonoscopy attempt) Medicines:                Monitored Anesthesia Care Procedure:                Pre-Anesthesia Assessment:                           - Prior to the procedure, a History and Physical                            was performed, and patient medications and                            allergies were reviewed. The patient's tolerance of                            previous anesthesia was also reviewed. The risks                            and benefits of the procedure and the sedation                            options and risks were discussed with the patient.                            All questions were answered, and informed consent                            was obtained. Prior Anticoagulants: The patient has                            taken no previous anticoagulant or antiplatelet                            agents. ASA Grade Assessment: I - A normal, healthy                            patient. After reviewing the risks and benefits,  the patient was deemed in satisfactory condition to                            undergo the procedure.                           After obtaining informed consent, the colonoscope                            was passed under direct vision. Throughout the                            procedure, the patient's blood pressure, pulse, and                            oxygen saturations were monitored  continuously. The                            Colonoscope was introduced through the anus and                            advanced to the the cecum, identified by                            appendiceal orifice and ileocecal valve. The                            colonoscopy was somewhat difficult due to a                            redundant colon, significant looping and a tortuous                            colon. Successful completion of the procedure was                            aided by using manual pressure. The patient                            tolerated the procedure well. The quality of the                            bowel preparation was excellent. The ileocecal                            valve, appendiceal orifice, and rectum were                            photographed. The bowel preparation used was 2 day                            Suprep/Miralax. Scope In: 11:49:47 AM Scope Out: 12:06:48 PM Scope Withdrawal Time: 0 hours 7 minutes 9 seconds  Total Procedure Duration: 0 hours 17 minutes 1 second  Findings:                 The perianal and digital rectal examinations were                            normal.                           The entire examined colon appeared normal on direct                            and retroflexion views. Complications:            No immediate complications. Estimated Blood Loss:     Estimated blood loss: none. Impression:               - The entire examined colon is normal on direct and                            retroflexion views.                           - No specimens collected.                           Constipation likely motility issue compunded by                            redundant colon anatomy.                           No hemorrhoids were seen. This appears to be benign                            anal bleeding related to chronic constipation. Recommendation:           - Patient has a contact number available for                             emergencies. The signs and symptoms of potential                            delayed complications were discussed with the                            patient. Return to normal activities tomorrow.                            Written discharge instructions were provided to the                            patient.                           - Resume previous diet.                           - Continue present medications. Gradually increase  miralax to a maximum of one capful twice daily. If                            not sufficiently controlling constipation, trial of                            Linzess.                           - Repeat colonoscopy in 10 years for screening                            purposes.                           - Return to my office at appointment to be                            scheduled. Jlyn Bracamonte L. Myrtie Neither, MD 06/27/2020 12:16:42 PM This report has been signed electronically.

## 2020-06-27 NOTE — Patient Instructions (Signed)
YOU HAD AN ENDOSCOPIC PROCEDURE TODAY AT THE [facility]:   Refer to the procedure report that was given to you for any specific questions about what was found during the examination.  If the procedure report does not answer your questions, please call your gastroenterologist to clarify.  If you requested that your care partner not be given the details of your procedure findings, then the procedure report has been included in a sealed envelope for you to review at your convenience later. ° °YOU SHOULD EXPECT: Some feelings of bloating in the abdomen. Passage of more gas than usual.  Walking can help get rid of the air that was put into your GI tract during the procedure and reduce the bloating. If you had a lower endoscopy (such as a colonoscopy or flexible sigmoidoscopy) you may notice spotting of blood in your stool or on the toilet paper. If you underwent a bowel prep for your procedure, you may not have a normal bowel movement for a few days. ° °Please Note:  You might notice some irritation and congestion in your nose or some drainage.  This is from the oxygen used during your procedure.  There is no need for concern and it should clear up in a day or so. ° °SYMPTOMS TO REPORT IMMEDIATELY: ° °Following lower endoscopy (colonoscopy or flexible sigmoidoscopy): ° Excessive amounts of blood in the stool ° Significant tenderness or worsening of abdominal pains ° Swelling of the abdomen that is new, acute ° Fever of 100°F or higher ° °For urgent or emergent issues, a gastroenterologist can be reached at any hour by calling [phone]. °Do not use MyChart messaging for urgent concerns.  ° ° °DIET:  We do recommend a small meal at first, but then you may proceed to your regular diet.  Drink plenty of fluids but you should avoid alcoholic beverages for 24 hours. ° °ACTIVITY:  You should plan to take it easy for the rest of today and you should NOT DRIVE or use heavy machinery until tomorrow (because of  the sedation medicines used during the test).   ° °FOLLOW UP: °Our staff will call the number listed on your records 48-72 hours following your procedure to check on you and address any questions or concerns that you may have regarding the information given to you following your procedure. If we do not reach you, we will leave a message.  We will attempt to reach you two times.  During this call, we will ask if you have developed any symptoms of COVID 19. If you develop any symptoms (ie: fever, flu-like symptoms, shortness of breath, cough etc.) before then, please call [phone].  If you test positive for Covid 19 in the 2 weeks post procedure, please call and report this information to us.   ° °SIGNATURES/CONFIDENTIALITY: °You and/or your care partner have signed paperwork which will be entered into your electronic medical record.  These signatures attest to the fact that that the information above on your After Visit Summary has been reviewed and is understood.  Full responsibility of the confidentiality of this discharge information lies with you and/or your care-partner.  °

## 2020-06-27 NOTE — Progress Notes (Signed)
Pt's states no medical or surgical changes since previsit or office visit.  CW - vitals 

## 2020-07-01 ENCOUNTER — Telehealth: Payer: Self-pay

## 2020-07-01 NOTE — Telephone Encounter (Signed)
  Follow up Call-  Call back number 06/27/2020 04/23/2020  Post procedure Call Back phone  # [phone]  Permission to leave phone message Yes Yes  Some recent data might be hidden     Patient questions:  Do you have a fever, pain , or abdominal swelling? No. Pain Score  0 *  Have you tolerated food without any problems? Yes.    Have you been able to return to your normal activities? Yes.    Do you have any questions about your discharge instructions: Diet   No. Medications  No. Follow up visit  No.  Do you have questions or concerns about your Care? No.  Actions: * If pain score is 4 or above: No action needed, pain <4.   1. Have you developed a fever since your procedure? No   2.   Have you had an respiratory symptoms (SOB or cough) since your procedure? No   3.   Have you tested positive for COVID 19 since your procedure? no   4.   Have you had any family members/close contacts diagnosed with the COVID 19 since your procedure?  No    If yes to any of these questions please route to Laverna Peace, RN and Karlton Lemon, RN

## 2020-07-23 ENCOUNTER — Ambulatory Visit (INDEPENDENT_AMBULATORY_CARE_PROVIDER_SITE_OTHER): Payer: BC Managed Care – PPO | Admitting: Gastroenterology

## 2020-07-23 ENCOUNTER — Encounter: Payer: Self-pay | Admitting: Gastroenterology

## 2020-07-23 VITALS — BP 110/80 | HR 81 | Ht 67.0 in | Wt 178.1 lb

## 2020-07-23 DIAGNOSIS — K5909 Other constipation: Secondary | ICD-10-CM

## 2020-07-23 DIAGNOSIS — K625 Hemorrhage of anus and rectum: Secondary | ICD-10-CM | POA: Diagnosis not present

## 2020-07-23 NOTE — Patient Instructions (Signed)
If you are age 46 or older, your body mass index should be between 23-30. Your Body mass index is 27.9 kg/m. If this is out of the aforementioned range listed, please consider follow up with your Primary Care Provider.  If you are age 93 or younger, your body mass index should be between 19-25. Your Body mass index is 27.9 kg/m. If this is out of the aformentioned range listed, please consider follow up with your Primary Care Provider.   Medication Samples have been provided to the patient.  Drug name: Linzess       Strength: 174mcg/290mcg        Qty: 8/4  LOT: [id number]   Exp.Date: 09-2020/09-2020  Dosing instructions: one a day, start with the and increase to if needed.   It was a pleasure to see you today!  Dr. Myrtie Neither

## 2020-07-23 NOTE — Progress Notes (Signed)
     [name]  Chief Complaint: Constipation and rectal bleeding  Subjective  History: [name] was last seen months ago for chronic constipation rectal bleeding felt to be hemorrhoidal.  She had previously seen Dr. Michaell Cowing from surgery and hemorrhoids were reportedly discovered on his exam. Patient typically has a BM every several days and has not had improvement with fiber or MiraLAX.  I did not find any hemorrhoids on anoscopy, and her initial colonoscopy June 16 at a poor preparation. Colonoscopy August 20 if we have 2-day preparation was a good quality exam, and revealed a normal colonoscopy with no hemorrhoids seen. She still struggles with having a BM about every 4 days.  MiraLAX for the last few weeks has periodically made the stool softer, but no more frequent and there is still been bleeding almost every BM.  She has bloating after a few days without a BM but no abdominal pain as such.  Appetite good and weight stable.  ROS: Cardiovascular:  no chest pain Respiratory: no dyspnea  The patient's Past Medical, Family and Social History were reviewed and are on file in the EMR.  Objective:  Med list reviewed  Current Outpatient Medications:  .  hydrocortisone (ANUSOL-HC) 2.5 % rectal cream, , Disp: , Rfl:  .  tretinoin (RETIN-A) 0.05 % cream, , Disp: , Rfl:  .  valACYclovir (VALTREX) 500 MG tablet, Take 500 mg by mouth daily. , Disp: , Rfl:    Vital signs in last 24 hrs: Vitals:   07/23/20 0824  BP: 110/80  Pulse: 81    Physical Exam  No exam.  Entire visit spent in discussion.  Labs:   ___________________________________________ Radiologic studies:   ____________________________________________ Other:   _____________________________________________ Assessment & Plan  Assessment: Encounter Diagnoses  Name Primary?  . Chronic constipation Yes  . Rectal bleeding     Chronic constipation, combination of redundant colon anatomy and  decreased motility.  History not consistent with pelvic floor dysfunction. Benign anal bleeding related to chronic constipation.  No hemorrhoids seen on anoscopy or colonoscopy that would be amenable to banding or surgery. We discussed the need to attend to the constipation did not improve the bleeding. Plan: Trial of Linzess, samples of 145 mcg once daily, if not improving in about a week, increase to to 90 mcg once daily. She will call us in about 2 weeks with an update.  If Linzess not helping or limited by side effects, then a trial of Motegrity if insurance will allow.   20 minutes were spent on this encounter (including chart review, history/exam, counseling/coordination of care, and documentation)  [name]

## 2021-08-06 NOTE — Telephone Encounter (Signed)
error 

## 2022-05-05 ENCOUNTER — Ambulatory Visit
Admission: EM | Admit: 2022-05-05 | Discharge: 2022-05-05 | Disposition: A | Discharge status: Home / Self Care | Payer: BC Managed Care – PPO | Attending: Family Medicine | Admitting: Family Medicine

## 2022-05-05 ENCOUNTER — Ambulatory Visit (INDEPENDENT_AMBULATORY_CARE_PROVIDER_SITE_OTHER): Payer: BC Managed Care – PPO

## 2022-05-05 ENCOUNTER — Ambulatory Visit: Payer: BC Managed Care – PPO

## 2022-05-05 DIAGNOSIS — M79674 Pain in right toe(s): Secondary | ICD-10-CM | POA: Diagnosis not present

## 2022-05-05 MED ORDER — IBUPROFEN 800 MG PO TABS: 800.0000 mg | ORAL_TABLET | Freq: Three times a day (TID) | ORAL | 0 refills | Status: AC | PRN

## 2022-05-05 NOTE — ED Provider Notes (Signed)
[facility]    CSN: [id number] Arrival date & time: 05/05/22  0844      History   Chief Complaint Chief Complaint  Patient presents with   Toe Injury    Entered by patient    HPI [name] is a 46 y.o. female.   HPI Here for right second toe pain.  This morning when she was getting ready she hit her toe on the side of the bathtub and heard a crack.  It is hurting and swollen.  She is allergic to penicillin  Her blood pressure is elevated today, and she is following it with her primary care.  She has an IUD and so has not had periods in a long time  History reviewed. No pertinent past medical history.  There are no problems to display for this patient.   Past Surgical History:  Procedure Laterality Date   CESAREAN SECTION  1996    OB History   No obstetric history on file.      Home Medications    Prior to Admission medications   Medication Sig Start Date End Date Taking? Authorizing Provider  hydrocortisone (ANUSOL-HC) 2.5 % rectal cream  04/11/20   [provider]  tretinoin (RETIN-A) 0.05 % cream  04/05/20   [provider]  valACYclovir (VALTREX) 500 MG tablet Take 500 mg by mouth daily.  02/29/20   [provider]    Family History Family History  Problem Relation Age of Onset   Colon cancer Neg Hx    Esophageal cancer Neg Hx    Rectal cancer Neg Hx    Stomach cancer Neg Hx     Social History Social History   Tobacco Use   Smoking status: Never   Smokeless tobacco: Never  Vaping Use   Vaping Use: Never used  Substance Use Topics   Alcohol use: Yes    Alcohol/week: 1.0 standard drink of alcohol    Types: 1 Glasses of wine per week   Drug use: Never     Allergies   Penicillins   Review of Systems Review of Systems   Physical Exam Triage Vital Signs ED Triage Vitals  Enc Vitals Group     BP 05/05/22 1004 (!) 167/126     Pulse Rate 05/05/22 1004 87     Resp 05/05/22 1004 18      Temp 05/05/22 1004 98.1 F (36.7 C)     Temp Source 05/05/22 1004 Oral     SpO2 05/05/22 1004 97 %     Weight --      Height --      Head Circumference --      Peak Flow --      Pain Score 05/05/22 1005 7     Pain Loc --      Pain Edu? --      Excl. in GC? --    No data found.  Updated Vital Signs BP (!) 167/126 (BP Location: Left Arm)   Pulse 87   Temp 98.1 F (36.7 C) (Oral)   Resp 18   SpO2 97%   Visual Acuity Right Eye Distance:   Left Eye Distance:   Bilateral Distance:    Right Eye Near:   Left Eye Near:    Bilateral Near:     Physical Exam Vitals reviewed.  Constitutional:      General: She is not in acute distress.    Appearance: She is not ill-appearing or toxic-appearing.  Musculoskeletal:  Comments: Right second toe is swollen and discolored.  Cap refill normal  Neurological:     Mental Status: She is alert and oriented to person, place, and time.  Psychiatric:        Behavior: Behavior normal.      UC Treatments / Results  Labs (all labs ordered are listed, but only abnormal results are displayed) Labs Reviewed - No data to display  EKG   Radiology No results found.  Procedures Procedures (including critical care time)  Medications Ordered in UC Medications - No data to display  Initial Impression / Assessment and Plan / UC Course  I have reviewed the triage vital signs and the nursing notes.  Pertinent labs & imaging results that were available during my care of the patient were reviewed by me and considered in my medical decision making (see chart for details).     X-ray does not show any fracture of the toe or other bones of her foot. Final Clinical Impressions(s) / UC Diagnoses   Final diagnoses:  None   Discharge Instructions   None    ED Prescriptions   None    PDMP not reviewed this encounter.   Zenia Resides, MD 05/05/22 [phone]

## 2022-05-05 NOTE — ED Triage Notes (Signed)
Pt presents with left second toe injury after hitting it against the shower this morning.

## 2022-05-05 NOTE — Discharge Instructions (Addendum)
Your x-ray was negative for fracture  Take ibuprofen 800 mg--1 tab every 8 hours as needed for pain.  Ice and elevate your foot today.

## 2022-06-24 ENCOUNTER — Other Ambulatory Visit: Payer: Self-pay | Admitting: Obstetrics and Gynecology

## 2022-06-24 DIAGNOSIS — N6310 Unspecified lump in the right breast, unspecified quadrant: Secondary | ICD-10-CM

## 2022-07-09 ENCOUNTER — Ambulatory Visit
Admission: RE | Admit: 2022-07-09 | Discharge: 2022-07-09 | Disposition: A | Discharge status: Home / Self Care | Payer: BC Managed Care – PPO | Source: Ambulatory Visit | Attending: Obstetrics and Gynecology | Admitting: Obstetrics and Gynecology

## 2022-07-09 DIAGNOSIS — N6310 Unspecified lump in the right breast, unspecified quadrant: Secondary | ICD-10-CM

## 2022-09-29 ENCOUNTER — Ambulatory Visit (HOSPITAL_BASED_OUTPATIENT_CLINIC_OR_DEPARTMENT_OTHER): Payer: BC Managed Care – PPO | Admitting: Cardiovascular Disease

## 2022-09-29 ENCOUNTER — Encounter (HOSPITAL_BASED_OUTPATIENT_CLINIC_OR_DEPARTMENT_OTHER): Payer: Self-pay

## 2022-10-12 ENCOUNTER — Encounter: Payer: Self-pay | Admitting: Cardiovascular Disease

## 2022-11-18 ENCOUNTER — Ambulatory Visit
Admission: EM | Admit: 2022-11-18 | Discharge: 2022-11-18 | Disposition: A | Discharge status: Home / Self Care | Payer: BC Managed Care – PPO

## 2024-05-08 ENCOUNTER — Other Ambulatory Visit: Payer: Self-pay

## 2024-05-08 ENCOUNTER — Encounter: Payer: Self-pay | Admitting: Allergy & Immunology

## 2024-05-08 ENCOUNTER — Ambulatory Visit: Payer: Self-pay | Admitting: Allergy & Immunology

## 2024-05-08 VITALS — BP 128/74 | HR 86 | Temp 97.7°F | Resp 18 | Ht 66.54 in | Wt 193.0 lb

## 2024-05-08 DIAGNOSIS — J310 Chronic rhinitis: Secondary | ICD-10-CM | POA: Diagnosis not present

## 2024-05-08 DIAGNOSIS — R21 Rash and other nonspecific skin eruption: Secondary | ICD-10-CM | POA: Insufficient documentation

## 2024-05-08 MED ORDER — MONTELUKAST SODIUM 10 MG PO TABS: 10.0000 mg | ORAL_TABLET | Freq: Every day | ORAL | 1 refills | Status: AC

## 2024-05-08 NOTE — Patient Instructions (Addendum)
 1. Chronic rhinitis - Because of insurance stipulations, we cannot do skin testing on the same day as your first visit. - We are all working to fight this, but for now we need to do two separate visits.  - We will know more after we do testing at the next visit.  - The skin testing visit can be squeezed in at your convenience.  - Then we can make a more full plan to address all of your symptoms. - Be sure to stop your antihistamines for 3 days before this appointment.  - Start Singulair (montelukast) 10mg  daily (this CAN be taken when we do skin testing). - We will know more after we do the testing.   2. Rash - Please email pictures to [email or web address] so that we can put them into your chart.  - We will see what happens with the testing to see if we can figure out a trigger. - We might need to do patch testing (to look for sensitivity to chemicals and cosmetics and metals) to see if there is something in your environment that is irritating your skin.   3. Return in about 1 week (around 05/15/2024) for SKIN TESTING (1-55 + 1-17). You can have the follow up appointment with Dr. Iva or a Nurse Practicioner (our Nurse Practitioners are excellent and always have Physician oversight!).    Please inform us  of any Emergency Department visits, hospitalizations, or changes in symptoms. Call us  before going to the ED for breathing or allergy symptoms since we might be able to fit you in for a sick visit. Feel free to contact us  anytime with any questions, problems, or concerns.  It was a pleasure to meet you today!  Websites that have reliable patient information: 1. American Academy of Asthma, Allergy, and Immunology: www.aaaai.org 2. Food Allergy Research and Education (FARE): foodallergy.org 3. Mothers of Asthmatics: http://www.asthmacommunitynetwork.org 4. American College of Allergy, Asthma, and Immunology: www.acaai.org      "Like" us  on Facebook and Instagram for our  latest updates!      A healthy democracy works best when [employer] participate! Make sure you are registered to vote! If you have moved or changed any of your contact information, you will need to get this updated before voting! Scan the QR codes below to learn more!

## 2024-05-08 NOTE — Progress Notes (Signed)
 NEW PATIENT  Date of Service/Encounter:  05/08/24  Consult requested by: [name], L.Addie, MD (Inactive)   Assessment:   Chronic rhinitis - planning for skin testing at the next visit  Rash  Plan/Recommendations:   1. Chronic rhinitis - Because of insurance stipulations, we cannot do skin testing on the same day as your first visit. - We are all working to fight this, but for now we need to do two separate visits.  - We will know more after we do testing at the next visit.  - The skin testing visit can be squeezed in at your convenience.  - Then we can make a more full plan to address all of your symptoms. - Be sure to stop your antihistamines for 3 days before this appointment.  - Start Singulair (montelukast) 10mg  daily (this CAN be taken when we do skin testing). - We will know more after we do the testing.   2. Rash - Please email pictures to [name] so that we can put them into your chart.  - We will see what happens with the testing to see if we can figure out a trigger. - We might need to do patch testing (to look for sensitivity to chemicals and cosmetics and metals) to see if there is something in your environment that is irritating your skin.   3. Return in about 1 week (around 05/15/2024) for SKIN TESTING (1-55 + 1-17). You can have the follow up appointment with Dr. Iva or a Nurse Practicioner (our Nurse Practitioners are excellent and always have Physician oversight!).   This note in its entirety was forwarded to the Provider who requested this consultation.  Subjective:   [name] is a 48 y.o. female presenting today for evaluation of  Chief Complaint  Patient presents with   Allergies   Allergic Reaction    Pt keep having breakout she has been to an dermatology but still unable to find out what she is allergic too.    [name] has a history of the following: Patient Active Problem List   Diagnosis Date  Noted   Chronic rhinitis 05/08/2024   Rash 05/08/2024    History obtained from: chart review and patient.  Discussed the use of AI scribe software for clinical note transcription with the patient and/or guardian, who gave verbal consent to proceed.  [name] was referred by [name], L.Addie, MD (Inactive).     [name] is a 48 y.o. female presenting for an evaluation of environmental allergies.  Allergic Rhinitis Symptom History: She experiences recurrent sinus issues characterized by frequent sinus infections, requiring antibiotics two to three times a year. Despite trying various allergy medications such as Zyrtec, Flonase, Allegra, and Xyzal, she has not found relief. Her body seemed to become immune to Zyrtec, while Allegra and Xyzal were ineffective. No history of asthma, ear infections, or pneumonias. She reports hoarseness related to post-nasal drip and denies any wheezing.  Skin Symptom History: She has a rash on her arms, first noticed about three months ago. The rash is intermittent, lasting about a week to a week and a half each time, and is not consistently itchy. She has been treated with clindamycin, a topical antibiotic, without resolution. No new changes to soaps, detergents, or cosmetics and does not associate the rash with any specific foods. She has sensitive skin and adheres to products she knows she can tolerate. She has a history of acne, which is being treated separately from the rash. She is going  to email pictures of the rash.   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   Otherwise, there is no history of other atopic diseases, including drug allergies, stinging insect allergies, or contact dermatitis. There is no significant infectious history. Vaccinations are up to date.    Past Medical History: Patient Active Problem List   Diagnosis Date  Noted   Chronic rhinitis 05/08/2024   Rash 05/08/2024    Medication List:  Allergies as of 05/08/2024       Reactions   Penicillins         Medication List        Accurate as of May 08, 2024 12:13 PM. If you have any questions, ask your nurse or doctor.          estradiol 0.1 MG/24HR patch Commonly known as: VIVELLE-DOT Place 1 patch onto the skin 2 (two) times a week.   hydrocortisone 2.5 % rectal cream Commonly known as: ANUSOL-HC   ibuprofen  800 MG tablet Commonly known as: ADVIL  Take 1 tablet (800 mg total) by mouth every 8 (eight) hours as needed (pain).   montelukast 10 MG tablet Commonly known as: Singulair Take 1 tablet (10 mg total) by mouth at bedtime. Started by: [name]   tretinoin 0.05 % cream Commonly known as: RETIN-A   valACYclovir 500 MG tablet Commonly known as: VALTREX Take 500 mg by mouth daily.        Birth History: non-contributory  Developmental History: non-contributory  Past Surgical History: Past Surgical History:  Procedure Laterality Date   CESAREAN SECTION  1996     Family History: Family History  Problem Relation Age of Onset   Breast cancer Paternal Aunt 83   Colon cancer Neg Hx    Esophageal cancer Neg Hx    Rectal cancer Neg Hx    Stomach cancer Neg Hx      Social History: [name] lives at home with her family. She lives in a house that is 50 years old. There is hardwood and carpeting in the main living areas and carpeting in the bedroom. There is gas heating and central cooling. There are no animals inside or outside of the home.  There are no dust mite covers on the bedding.  There is no tobacco exposure.  She currently works as a Medical illustrator for [name].  She has done this for 23 years.  There is no fume, chemical, or dust exposure.  They do have a HEPA filter in the home.  They do not live near an interstate or industrial area.   Review of systems otherwise negative  other than that mentioned in the HPI.    Objective:   Blood pressure 128/74, pulse 86, temperature 97.7 F (36.5 C), temperature source Temporal, resp. rate 18, height 5' 6.54 (1.69 m), weight 193 lb (87.5 kg), SpO2 98%. Body mass index is 30.65 kg/m.     Physical Exam Vitals reviewed.  Constitutional:      Appearance: She is well-developed.     Comments: Pleasant.  Cooperative with the exam.  HENT:     Head: Normocephalic and atraumatic.     Right Ear: Tympanic membrane, ear canal and external ear normal. No drainage, swelling or tenderness. Tympanic membrane is not injected, scarred, erythematous, retracted or bulging.     Left Ear: Tympanic membrane, ear canal and external ear normal. No drainage,  swelling or tenderness. Tympanic membrane is not injected, scarred, erythematous, retracted or bulging.     Nose: No nasal deformity, septal deviation, mucosal edema or rhinorrhea.     Right Turbinates: Enlarged, swollen and pale.     Left Turbinates: Enlarged, swollen and pale.     Right Sinus: No maxillary sinus tenderness or frontal sinus tenderness.     Left Sinus: No maxillary sinus tenderness or frontal sinus tenderness.     Mouth/Throat:     Mouth: Mucous membranes are not pale and not dry.     Pharynx: Uvula midline.   Eyes:     General: Lids are normal. Allergic shiner present.        Right eye: No discharge.        Left eye: No discharge.     Conjunctiva/sclera: Conjunctivae normal.     Right eye: Right conjunctiva is not injected. No chemosis.    Left eye: Left conjunctiva is not injected. No chemosis.    Pupils: Pupils are equal, round, and reactive to light.    Cardiovascular:     Rate and Rhythm: Normal rate and regular rhythm.     Heart sounds: Normal heart sounds.  Pulmonary:     Effort: Pulmonary effort is normal. No tachypnea, accessory muscle usage or respiratory distress.     Breath sounds: Normal breath sounds. No wheezing, rhonchi or rales.      Comments: Moving air well in all lung fields.  No increased work of breathing. Chest:     Chest wall: No tenderness.  Abdominal:     Tenderness: There is no abdominal tenderness. There is no guarding or rebound.  Lymphadenopathy:     Head:     Right side of head: No submandibular, tonsillar or occipital adenopathy.     Left side of head: No submandibular, tonsillar or occipital adenopathy.     Cervical: No cervical adenopathy.   Skin:    General: Skin is warm.     Capillary Refill: Capillary refill takes less than 2 seconds.     Coloration: Skin is not pale.     Findings: No abrasion, erythema, petechiae or rash. Rash is not papular, urticarial or vesicular.     Comments: Skin is clear today.  She does show me a picture of what look like multiple papules over the bilateral arms.   Neurological:     Mental Status: She is alert.   Psychiatric:        Behavior: Behavior is cooperative.      Diagnostic studies: deferred due to insurance stipulations that require a separate visit for testing          Cassidy Shaggy, MD [facility] 

## 2024-05-15 ENCOUNTER — Ambulatory Visit (INDEPENDENT_AMBULATORY_CARE_PROVIDER_SITE_OTHER): Admitting: Allergy & Immunology

## 2024-05-15 DIAGNOSIS — J3089 Other allergic rhinitis: Secondary | ICD-10-CM | POA: Diagnosis not present

## 2024-05-15 DIAGNOSIS — R21 Rash and other nonspecific skin eruption: Secondary | ICD-10-CM

## 2024-05-15 DIAGNOSIS — J302 Other seasonal allergic rhinitis: Secondary | ICD-10-CM | POA: Diagnosis not present

## 2024-05-15 MED ORDER — RYALTRIS 665-25 MCG/ACT NA SUSP: 2.0000 | Freq: Two times a day (BID) | NASAL | 5 refills | Status: AC | PRN

## 2024-05-15 NOTE — Progress Notes (Unsigned)
 FOLLOW UP  Date of Service/Encounter:  05/15/24   Assessment:   Seasonal and perennial allergic rhinitis (grasses, ragweed, weeds, trees, indoor molds, and outdoor molds)  Rash - consider patch testing   Plan/Recommendations:   1. Chronic rhinitis - Testing today showed: grasses, ragweed, weeds, trees, indoor molds, and outdoor molds - Copy of test results provided.  - Avoidance measures provided. - Continue with: Singulair  (montelukast ) 10mg  daily - Start taking: Ryaltris  (olopatadine/mometasone) two sprays per nostril 1-2 times daily as needed - This is sent to a specialty pharmacy [facility]) who will reach out to discuss the delivery process.  - You can use an extra dose of the antihistamine, if needed, for breakthrough symptoms.  - Consider nasal saline rinses 1-2 times daily to remove allergens from the nasal cavities as well as help with mucous clearance (this is especially helpful to do before the nasal sprays are given) - Consider allergy  shots as a means of long-term control. - Allergy  shots re-train and reset the immune system to ignore environmental allergens and decrease the resulting immune response to those allergens (sneezing, itchy watery eyes, runny nose, nasal congestion, etc).    - Allergy  shots improve symptoms in 75-85% of patients.  - We can discuss more at the next appointment if the medications are not working for you.  2. Rash - We might need to do patch testing (to look for sensitivity to chemicals and cosmetics and metals) to see if there is something in your environment that is irritating your skin.  - Continue to take pictures.   3. Return in about 3 months (around 08/15/2024). You can have the follow up appointment with Dr. Iva or a Nurse Practicioner (our Nurse Practitioners are excellent and always have Physician oversight!).   Subjective:   [name] is a 48 y.o. female presenting today for follow up of No chief complaint on  file.   [name] has a history of the following: Patient Active Problem List   Diagnosis Date Noted   Chronic rhinitis 05/08/2024   Rash 05/08/2024    History obtained from: chart review and patient.  Discussed the use of AI scribe software for clinical note transcription with the patient and/or guardian, who gave verbal consent to proceed.  [name] is a 48 y.o. female presenting for skin testing. She was last seen on July 1st. We could not do testing because her insurance company does not cover testing on the same day as a New Patient visit. She has been off of all antihistamines 3 days in anticipation of the testing.   We did get pictures of her rash that she emailed to the office email address:           Otherwise, there have been no changes to her past medical history, surgical history, family history, or social history.    Review of systems otherwise negative other than that mentioned in the HPI.    Objective:   There were no vitals taken for this visit. There is no height or weight on file to calculate BMI.    Physical exam deferred since this was a skin testing appointment only.   Diagnostic studies:   Allergy  Studies:     Airborne Adult Perc - 05/15/24 1500     Time Antigen Placed 1512    Allergen Manufacturer Jestine    Location Back    Number of Test 55    Panel 1 Select    1. Control-Buffer 50% Glycerol Negative  2. Control-Histamine 2+    3. Bahia 3+    4. [place] 2+    5. Johnson 3+    6. [place]  Blue 3+    7. Meadow Fescue 3+    8. Perennial Rye 3+    9. Timothy 3+    10. Ragweed Mix Negative    11. Cocklebur Negative    12. Plantain,  English Negative    13. Baccharis Negative    14. Dog Fennel Negative    15. Russian Thistle Negative    16. Lamb's Quarters 2+    17. Sheep Sorrell Negative    18. Rough Pigweed Negative    19. Marsh Elder, Rough Negative    20. Mugwort, Common Negative    21. Box, Elder Negative    22.  Cedar, red Negative    23. Sweet Gum Negative    24. Pecan Pollen 2+    25. Pine Mix Negative    26. Walnut, Black Pollen Negative    27. Red Mulberry Negative    28. Ash Mix Negative    29. Birch Mix Negative    30. Beech American Negative    31. Cottonwood, Guinea-[place]iss[place] Negative    32. Hickory, White 3+    33. Maple Mix Negative    34. Oak, [place] Mix Negative    35. Sycamore Eastern Negative    36. Alternaria Alternata Negative    37. Cladosporium Herbarum Negative    38. Aspergillus Mix Negative    39. Penicillium Mix Negative    40. Bipolaris Sorokiniana (Helminthosporium) Negative    41. Drechslera Spicifera (Curvularia) Negative    42. Mucor Plumbeus Negative    43. Fusarium Moniliforme Negative    44. Aureobasidium Pullulans (pullulara) Negative    45. Rhizopus Oryzae Negative    46. Botrytis Cinera Negative    47. Epicoccum Nigrum Negative    48. Phoma Betae Negative    49. Dust Mite Mix Negative    50. Cat Hair 10,000 BAU/ml Negative    51.  Dog Epithelia Negative    52. Mixed Feathers Negative    53. Horse Epithelia Negative    54. Cockroach, German Negative    55. Tobacco Leaf Negative          Intradermal - 05/15/24 1559     Time Antigen Placed 1400    Allergen Manufacturer Greer    Location Arm    Number of Test 10    Control Negative    Ragweed Mix 2+    Mold 1 1+    Mold 2 2+    Mold 3 Negative    Mold 4 3+    Mite Mix Negative    Cat Negative    Dog Negative    Cockroach Negative          Food Adult Perc - 05/15/24 1500     Time Antigen Placed 1512    Allergen Manufacturer Jestine    Location Back    Number of allergen test 17    1. Peanut Negative    2. Soybean Negative    3. Wheat Negative    4. Sesame Negative    5. Milk, Cow Negative    6. Casein Negative    7. Egg White, Chicken Negative    8. Shellfish Mix Negative    9. Fish Mix Negative    10. Cashew Negative    11. Walnut Food Negative    12. Almond Negative    13.  Hazelnut Negative    14. Pecan Food Negative    15. Pistachio Negative    16. [place] Nut Negative    17. Coconut Negative    Comments n          Allergy  testing results were read and interpreted by myself, documented by clinical staff.      Marty Shaggy, MD  [facility] 

## 2024-05-15 NOTE — Patient Instructions (Addendum)
 1. Chronic rhinitis - Testing today showed: grasses, ragweed, weeds, trees, indoor molds, and outdoor molds - Copy of test results provided.  - Avoidance measures provided. - Continue with: Singulair  (montelukast ) 10mg  daily - Start taking: Ryaltris  (olopatadine/mometasone) two sprays per nostril 1-2 times daily as needed - This is sent to a specialty pharmacy [facility]) who will reach out to discuss the delivery process.  - You can use an extra dose of the antihistamine, if needed, for breakthrough symptoms.  - Consider nasal saline rinses 1-2 times daily to remove allergens from the nasal cavities as well as help with mucous clearance (this is especially helpful to do before the nasal sprays are given) - Consider allergy  shots as a means of long-term control. - Allergy  shots re-train and reset the immune system to ignore environmental allergens and decrease the resulting immune response to those allergens (sneezing, itchy watery eyes, runny nose, nasal congestion, etc).    - Allergy  shots improve symptoms in 75-85% of patients.  - We can discuss more at the next appointment if the medications are not working for you.  2. Rash - We might need to do patch testing (to look for sensitivity to chemicals and cosmetics and metals) to see if there is something in your environment that is irritating your skin.  - Continue to take pictures.   3. Return in about 3 months (around 08/15/2024). You can have the follow up appointment with Dr. Iva or a Nurse Practicioner (our Nurse Practitioners are excellent and always have Physician oversight!).    Please inform us  of any Emergency Department visits, hospitalizations, or changes in symptoms. Call us  before going to the ED for breathing or allergy  symptoms since we might be able to fit you in for a sick visit. Feel free to contact us  anytime with any questions, problems, or concerns.  It was a pleasure to meet you today!  Websites that have  reliable patient information: 1. American Academy of Asthma, Allergy , and Immunology: www.aaaai.org 2. Food Allergy  Research and Education (FARE): foodallergy.org 3. Mothers of Asthmatics: http://www.asthmacommunitynetwork.org 4. American College of Allergy , Asthma, and Immunology: www.acaai.org      "Like" us  on Facebook and Instagram for our latest updates!      A healthy democracy works best when [employer] participate! Make sure you are registered to vote! If you have moved or changed any of your contact information, you will need to get this updated before voting! Scan the QR codes below to learn more!       Airborne Adult Perc - 05/15/24 1500     Time Antigen Placed 1512    Allergen Manufacturer Jestine    Location Back    Number of Test 55    Panel 1 Select    1. Control-Buffer 50% Glycerol Negative    2. Control-Histamine 2+    3. Bahia 3+    4. [place] 2+    5. Johnson 3+    6. [place]  Blue 3+    7. Meadow Fescue 3+    8. Perennial Rye 3+    9. Timothy 3+    10. Ragweed Mix Negative    11. Cocklebur Negative    12. Plantain,  English Negative    13. Baccharis Negative    14. Dog Fennel Negative    15. Russian Thistle Negative    16. Lamb's Quarters 2+    17. Sheep Sorrell Negative    18. Rough Pigweed Negative    19. [place]ac [place], Rough  Negative    20. Mugwort, Common Negative    21. Box, Elder Negative    22. Cedar, red Negative    23. Sweet Gum Negative    24. Pecan Pollen 2+    25. Pine Mix Negative    26. Walnut, Black Pollen Negative    27. Red Mulberry Negative    28. Ash Mix Negative    29. Birch Mix Negative    30. Beech American Negative    31. Cottonwood, Guinea-Biss[place] Negative    32. Hickory, White 3+    33. Maple Mix Negative    34. Oak, [place] Mix Negative    35. Sycamore Eastern Negative    36. Alternaria Alternata Negative    37. Cladosporium Herbarum Negative    38. Aspergillus Mix Negative    39. Penicillium Mix Negative    40.  Bipolaris Sorokiniana (Helminthosporium) Negative    41. Drechslera Spicifera (Curvularia) Negative    42. Mucor Plumbeus Negative    43. Fusarium Moniliforme Negative    44. Aureobasidium Pullulans (pullulara) Negative    45. Rhizopus Oryzae Negative    46. Botrytis Cinera Negative    47. Epicoccum Nigrum Negative    48. Phoma Betae Negative    49. Dust Mite Mix Negative    50. Cat Hair 10,000 BAU/ml Negative    51.  Dog Epithelia Negative    52. Mixed Feathers Negative    53. Horse Epithelia Negative    54. Cockroach, German Negative    55. Tobacco Leaf Negative          Intradermal - 05/15/24 1559     Time Antigen Placed 1400    Allergen Manufacturer Greer    Location Arm    Number of Test 10    Control Negative    Ragweed Mix 2+    Mold 1 1+    Mold 2 2+    Mold 3 Negative    Mold 4 3+    Mite Mix Negative    Cat Negative    Dog Negative    Cockroach Negative          Food Adult Perc - 05/15/24 1500     Time Antigen Placed 1512    Allergen Manufacturer Jestine    Location Back    Number of allergen test 17    1. Peanut Negative    2. Soybean Negative    3. Wheat Negative    4. Sesame Negative    5. Milk, Cow Negative    6. Casein Negative    7. Egg White, Chicken Negative    8. Shellfish Mix Negative    9. Fish Mix Negative    10. Cashew Negative    11. Walnut Food Negative    12. Almond Negative    13. Hazelnut Negative    14. Pecan Food Negative    15. Pistachio Negative    16. [place] Nut Negative    17. Coconut Negative    Comments n          Reducing Pollen Exposure  The American Academy of Allergy , Asthma and Immunology suggests the following steps to reduce your exposure to pollen during allergy  seasons.    Do not hang sheets or clothing out to dry; pollen may collect on these items. Do not mow lawns or spend time around freshly cut grass; mowing stirs up pollen. Keep windows closed at night.  Keep car windows closed while  driving. Minimize morning activities outdoors, a  time when pollen counts are usually at their highest. Stay indoors as much as possible when pollen counts or humidity is high and on windy days when pollen tends to remain in the air longer. Use air conditioning when possible.  Many air conditioners have filters that trap the pollen spores. Use a HEPA room air filter to remove pollen form the indoor air you breathe.  Control of Mold Allergen   Mold and fungi can grow on a variety of surfaces provided certain temperature and moisture conditions exist.  Outdoor molds grow on plants, decaying vegetation and soil.  The major outdoor mold, Alternaria and Cladosporium, are found in very high numbers during hot and dry conditions.  Generally, a late Summer - Fall peak is seen for common outdoor fungal spores.  Rain will temporarily lower outdoor mold spore count, but counts rise rapidly when the rainy period ends.  The most important indoor molds are Aspergillus and Penicillium.  Dark, humid and poorly ventilated basements are ideal sites for mold growth.  The next most common sites of mold growth are the bathroom and the kitchen.  Outdoor (Seasonal) Mold Control   Use air conditioning and keep windows closed Avoid exposure to decaying vegetation. Avoid leaf raking. Avoid grain handling. Consider wearing a face mask if working in moldy areas.    Indoor (Perennial) Mold Control    Maintain humidity below 50%. Clean washable surfaces with 5% bleach solution. Remove sources e.g. contaminated carpets.    Allergy  Shots  Allergies are the result of a chain reaction that starts in the immune system. Your immune system controls how your body defends itself. For instance, if you have an allergy  to pollen, your immune system identifies pollen as an invader or allergen. Your immune system overreacts by producing antibodies called Immunoglobulin E (IgE). These antibodies travel to cells that release  chemicals, causing an allergic reaction.  The concept behind allergy  immunotherapy, whether it is received in the form of shots or tablets, is that the immune system can be desensitized to specific allergens that trigger allergy  symptoms. Although it requires time and patience, the payback can be long-term relief. Allergy  injections contain a dilute solution of those substances that you are allergic to based upon your skin testing and allergy  history.   How Do Allergy  Shots Work?  Allergy  shots work much like a vaccine. Your body responds to injected amounts of a particular allergen given in increasing doses, eventually developing a resistance and tolerance to it. Allergy  shots can lead to decreased, minimal or no allergy  symptoms.  There generally are two phases: build-up and maintenance. Build-up often ranges from three to six months and involves receiving injections with increasing amounts of the allergens. The shots are typically given once or twice a week, though more rapid build-up schedules are sometimes used.  The maintenance phase begins when the most effective dose is reached. This dose is different for each person, depending on how allergic you are and your response to the build-up injections. Once the maintenance dose is reached, there are longer periods between injections, typically two to four weeks.  Occasionally doctors give cortisone-type shots that can temporarily reduce allergy  symptoms. These types of shots are different and should not be confused with allergy  immunotherapy shots.  Who Can Be Treated with Allergy  Shots?  Allergy  shots may be a good treatment approach for people with allergic rhinitis (hay fever), allergic asthma, conjunctivitis (eye allergy ) or stinging insect allergy .   Before deciding to begin allergy  shots, you  should consider:   The length of allergy  season and the severity of your symptoms  Whether medications and/or changes to your environment can  control your symptoms  Your desire to avoid long-term medication use  Time: allergy  immunotherapy requires a major time commitment  Cost: may vary depending on your insurance coverage  Allergy  shots for children age 2 and older are effective and often well tolerated. They might prevent the onset of new allergen sensitivities or the progression to asthma.  Allergy  shots are not started on patients who are pregnant but can be continued on patients who become pregnant while receiving them. In some patients with other medical conditions or who take certain common medications, allergy  shots may be of risk. It is important to mention other medications you talk to your allergist.   What are the two types of build-ups offered:   RUSH or Rapid Desensitization -- one day of injections lasting from 8:30-4:30pm, injections every 1 hour.  Approximately half of the build-up process is completed in that one day.  The following week, normal build-up is resumed, and this entails ~16 visits either weekly or twice weekly, until reaching your "maintenance dose" which is continued weekly until eventually getting spaced out to every month for a duration of 3 to 5 years. The regular build-up appointments are nurse visits where the injections are administered, followed by required monitoring for 30 minutes.    Traditional build-up -- weekly visits for 6 -12 months until reaching "maintenance dose", then continue weekly until eventually spacing out to every 4 weeks as above. At these appointments, the injections are administered, followed by required monitoring for 30 minutes.     Either way is acceptable, and both are equally effective. With the rush protocol, the advantage is that less time is spent here for injections overall AND you would also reach maintenance dosing faster (which is when the clinical benefit starts to become more apparent). Not everyone is a candidate for rapid desensitization.   IF we proceed  with the RUSH protocol, there are premedications which must be taken the day before and the day after the rush only (this includes antihistamines, steroids, and Singulair ).  After the rush day, no prednisone or Singulair  is required, and we just recommend antihistamines taken on your injection day.  What Is An Estimate of the Costs?  If you are interested in starting allergy  injections, please check with your insurance company about your coverage for both allergy  vial sets and allergy  injections.  Please do so prior to making the appointment to start injections.  The following are CPT codes to give to your insurance company. These are the amounts we BILL to the insurance company, but the amount YOU WILL PAY and WE RECEIVE IS SUBSTANTIALLY LESS and depends on the contracts we have with different insurance companies.   Amount Billed to Insurance One allergy  vial set  CPT 95165   $ 1200     Two allergy  vial set  CPT 95165   $ 2400     Three allergy  vial set  CPT 95165   $ 3600     One injection   CPT 95115   $ 35  Two injections   CPT 95117   $ 40 RUSH (Rapid Desensitization) CPT 95180 x 8 hours $500/hour  Regarding the allergy  injections, your co-pay may or may not apply with each injection, so please confirm this with your insurance company. When you start allergy  injections, 1 or 2 sets of vials are  made based on your allergies.  Not all patients can be on one set of vials. A set of vials lasts 6 months to a year depending on how quickly you can proceed with your build-up of your allergy  injections. Vials are personalized for each patient depending on their specific allergens.  How often are allergy  injection given during the build-up period?   Injections are given at least weekly during the build-up period until your maintenance dose is achieved. Per the doctor's discretion, you may have the option of getting allergy  injections two times per week during the build-up period. However, there must be  at least 48 hours between injections. The build-up period is usually completed within 6-12 months depending on your ability to schedule injections and for adjustments for reactions. When maintenance dose is reached, your injection schedule is gradually changed to every two weeks and later to every three weeks. Injections will then continue every 4 weeks. Usually, injections are continued for a total of 3-5 years.   When Will I Feel Better?  Some may experience decreased allergy  symptoms during the build-up phase. For others, it may take as long as 12 months on the maintenance dose. If there is no improvement after a year of maintenance, your allergist will discuss other treatment options with you.  If you aren't responding to allergy  shots, it may be because there is not enough dose of the allergen in your vaccine or there are missing allergens that were not identified during your allergy  testing. Other reasons could be that there are high levels of the allergen in your environment or major exposure to non-allergic triggers like tobacco smoke.  What Is the Length of Treatment?  Once the maintenance dose is reached, allergy  shots are generally continued for three to five years. The decision to stop should be discussed with your allergist at that time. Some people may experience a permanent reduction of allergy  symptoms. Others may relapse and a longer course of allergy  shots can be considered.  What Are the Possible Reactions?  The two types of adverse reactions that can occur with allergy  shots are local and systemic. Common local reactions include very mild redness and swelling at the injection site, which can happen immediately or several hours after. Report a delayed reaction from your last injection. These include arm swelling or runny nose, watery eyes or cough that occurs within 12-24 hours after injection. A systemic reaction, which is less common, affects the entire body or a particular body  system. They are usually mild and typically respond quickly to medications. Signs include increased allergy  symptoms such as sneezing, a stuffy nose or hives.   Rarely, a serious systemic reaction called anaphylaxis can develop. Symptoms include swelling in the throat, wheezing, a feeling of tightness in the chest, nausea or dizziness. Most serious systemic reactions develop within 30 minutes of allergy  shots. This is why it is strongly recommended you wait in your doctor's office for 30 minutes after your injections. Your allergist is trained to watch for reactions, and his or her staff is trained and equipped with the proper medications to identify and treat them.   Report to the nurse immediately if you experience any of the following symptoms: swelling, itching or redness of the skin, hives, watery eyes/nose, breathing difficulty, excessive sneezing, coughing, stomach pain, diarrhea, or light headedness. These symptoms may occur within 15-20 minutes after injection and may require medication.   Who Should Administer Allergy  Shots?  The preferred location for receiving shots is  your prescribing allergist's office. Injections can sometimes be given at another facility where the physician and staff are trained to recognize and treat reactions, and have received instructions by your prescribing allergist.  What if I am late for an injection?   Injection dose will be adjusted depending upon how many days or weeks you are late for your injection.   What if I am sick?   Please report any illness to the nurse before receiving injections. She may adjust your dose or postpone injections depending on your symptoms. If you have fever, flu, sinus infection or chest congestion it is best to postpone allergy  injections until you are better. Never get an allergy  injection if your asthma is causing you problems. If your symptoms persist, seek out medical care to get your health problem under control.  What If I  am or Become Pregnant:  Women that become pregnant should schedule an appointment with [facility] before receiving any further allergy  injections.

## 2024-05-16 ENCOUNTER — Encounter: Payer: Self-pay | Admitting: Allergy & Immunology

## 2024-07-16 ENCOUNTER — Encounter: Payer: Self-pay | Admitting: Family Medicine
